# Patient Record
Sex: Male | Born: 1960 | Race: White | Hispanic: No | Smoking: Current every day smoker
Health system: Southern US, Community
[De-identification: ages and names within clinical notes are randomized; demographics above are authoritative.]

## PROBLEM LIST (undated history)

## (undated) DIAGNOSIS — E119 Type 2 diabetes mellitus without complications: Secondary | ICD-10-CM

## (undated) DIAGNOSIS — J189 Pneumonia, unspecified organism: Secondary | ICD-10-CM

## (undated) DIAGNOSIS — G43909 Migraine, unspecified, not intractable, without status migrainosus: Secondary | ICD-10-CM

## (undated) DIAGNOSIS — E782 Mixed hyperlipidemia: Secondary | ICD-10-CM

## (undated) DIAGNOSIS — I10 Essential (primary) hypertension: Secondary | ICD-10-CM

## (undated) DIAGNOSIS — D649 Anemia, unspecified: Secondary | ICD-10-CM

## (undated) DIAGNOSIS — K219 Gastro-esophageal reflux disease without esophagitis: Secondary | ICD-10-CM

## (undated) DIAGNOSIS — M1990 Unspecified osteoarthritis, unspecified site: Secondary | ICD-10-CM

## (undated) DIAGNOSIS — R251 Tremor, unspecified: Secondary | ICD-10-CM

## (undated) DIAGNOSIS — G4730 Sleep apnea, unspecified: Secondary | ICD-10-CM

## (undated) HISTORY — PX: KNEE ARTHROSCOPY W/ DEBRIDEMENT: SHX1867

## (undated) HISTORY — DX: Unspecified osteoarthritis, unspecified site: M19.90

## (undated) HISTORY — DX: Migraine, unspecified, not intractable, without status migrainosus: G43.909

## (undated) HISTORY — DX: Tremor, unspecified: R25.1

## (undated) HISTORY — DX: Mixed hyperlipidemia: E78.2

## (undated) HISTORY — DX: Type 2 diabetes mellitus without complications: E11.9

---

## 1987-08-23 DIAGNOSIS — R091 Pleurisy: Secondary | ICD-10-CM

## 1987-08-23 HISTORY — DX: Pleurisy: R09.1

## 1999-07-05 ENCOUNTER — Encounter: Admission: RE | Admit: 1999-07-05 | Discharge: 1999-07-05 | Payer: Self-pay | Admitting: Emergency Medicine

## 1999-07-05 ENCOUNTER — Encounter: Payer: Self-pay | Admitting: Emergency Medicine

## 2001-03-21 ENCOUNTER — Encounter: Payer: Self-pay | Admitting: Emergency Medicine

## 2001-03-21 ENCOUNTER — Encounter: Admission: RE | Admit: 2001-03-21 | Discharge: 2001-03-21 | Payer: Self-pay | Admitting: Emergency Medicine

## 2003-06-16 ENCOUNTER — Encounter: Admission: RE | Admit: 2003-06-16 | Discharge: 2003-06-16 | Payer: Self-pay | Admitting: Emergency Medicine

## 2003-06-16 ENCOUNTER — Encounter: Payer: Self-pay | Admitting: Emergency Medicine

## 2003-10-30 ENCOUNTER — Emergency Department (HOSPITAL_COMMUNITY): Admission: EM | Admit: 2003-10-30 | Discharge: 2003-10-30 | Payer: Self-pay | Admitting: Emergency Medicine

## 2004-09-20 IMAGING — CR DG FINGER INDEX 2+V*L*
1 series · 1 of 1 positions shown · non-contrast
Comparison: none

CLINICAL DATA: laceration left index finger
 LEFT INDEX FINGER THREE VIEWS
 There is no evidence of acute fracture, dislocation or radiopaque foreign body.  Soft tissue irregularity is noted distally compatible with given history of laceration.  
 IMPRESSION
 No evidence of fracture or foreign body.

[view not recorded]
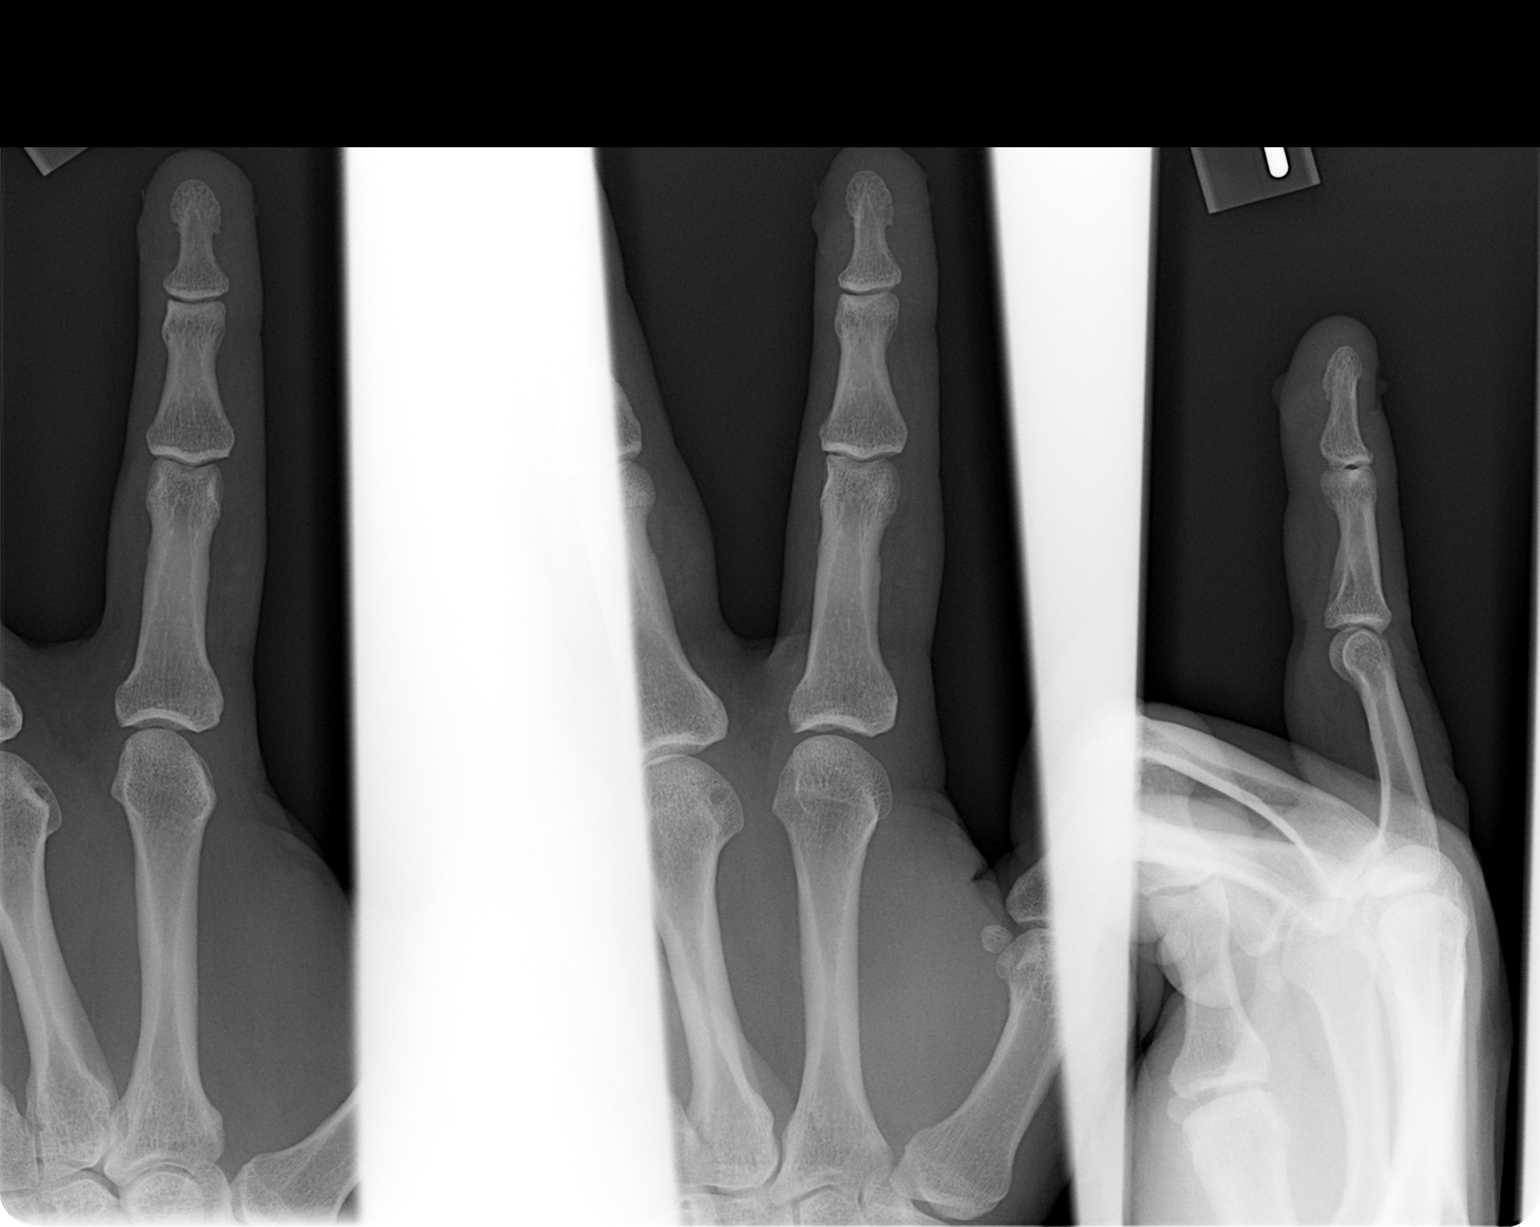

[1 of 1 positions shown; findings below may reference images not displayed]

## 2009-04-02 DIAGNOSIS — M1990 Unspecified osteoarthritis, unspecified site: Secondary | ICD-10-CM | POA: Insufficient documentation

## 2009-04-02 DIAGNOSIS — E669 Obesity, unspecified: Secondary | ICD-10-CM | POA: Insufficient documentation

## 2009-04-02 DIAGNOSIS — J309 Allergic rhinitis, unspecified: Secondary | ICD-10-CM | POA: Insufficient documentation

## 2009-05-06 ENCOUNTER — Encounter: Admission: RE | Admit: 2009-05-06 | Discharge: 2009-05-06 | Payer: Self-pay | Admitting: Family Medicine

## 2014-08-22 DIAGNOSIS — I2699 Other pulmonary embolism without acute cor pulmonale: Secondary | ICD-10-CM

## 2014-08-22 HISTORY — DX: Other pulmonary embolism without acute cor pulmonale: I26.99

## 2019-08-23 HISTORY — PX: PARTIAL HIP ARTHROPLASTY: SHX733

## 2019-09-10 ENCOUNTER — Ambulatory Visit: Payer: BLUE CROSS/BLUE SHIELD | Attending: Internal Medicine

## 2019-09-10 DIAGNOSIS — Z23 Encounter for immunization: Secondary | ICD-10-CM | POA: Insufficient documentation

## 2019-09-10 NOTE — Progress Notes (Signed)
   Covid-19 Vaccination Clinic  Name:  [name]    MRN: [id number] DOB: [birth year]  09/10/2019  [name] was observed post Covid-19 immunization for 15 minutes without incidence. He was provided with Vaccine Information Sheet and instruction to access the V-Safe system.   [name] was instructed to call 911 with any severe reactions post vaccine: Marland Kitchen Difficulty breathing  . Swelling of your face and throat  . A fast heartbeat  . A bad rash all over your body  . Dizziness and weakness    Immunizations Administered    Name Date Dose VIS Date Route   Pfizer COVID-19 Vaccine 09/10/2019  6:49 PM 0.3 mL 08/02/2019 Intramuscular   Manufacturer: [employer], [employer]   Lot: [id number]   NDC: [address]-1

## 2019-10-01 ENCOUNTER — Ambulatory Visit: Payer: BLUE CROSS/BLUE SHIELD | Attending: Internal Medicine

## 2019-10-01 DIAGNOSIS — Z23 Encounter for immunization: Secondary | ICD-10-CM | POA: Insufficient documentation

## 2019-10-01 NOTE — Progress Notes (Signed)
   Covid-19 Vaccination Clinic  Name:  Charles Payne    MRN: [id number] DOB: Mar 25, 1961  10/01/2019  [name] was observed post Covid-19 immunization for 15 minutes without incidence. He was provided with Vaccine Information Sheet and instruction to access the V-Safe system.   [name] was instructed to call 911 with any severe reactions post vaccine: Marland Kitchen Difficulty breathing  . Swelling of your face and throat  . A fast heartbeat  . A bad rash all over your body  . Dizziness and weakness    Immunizations Administered    Name Date Dose VIS Date Route   Pfizer COVID-19 Vaccine 10/01/2019  2:52 PM 0.3 mL 08/02/2019 Intramuscular   Manufacturer: [employer], [employer]   Lot: [id number]   NDC: [address]-2

## 2020-02-07 DIAGNOSIS — M25852 Other specified joint disorders, left hip: Secondary | ICD-10-CM | POA: Insufficient documentation

## 2020-02-07 DIAGNOSIS — R29818 Other symptoms and signs involving the nervous system: Secondary | ICD-10-CM | POA: Insufficient documentation

## 2020-02-07 DIAGNOSIS — G59 Mononeuropathy in diseases classified elsewhere: Secondary | ICD-10-CM | POA: Insufficient documentation

## 2020-02-11 DIAGNOSIS — M51369 Other intervertebral disc degeneration, lumbar region without mention of lumbar back pain or lower extremity pain: Secondary | ICD-10-CM | POA: Insufficient documentation

## 2020-02-11 DIAGNOSIS — M47816 Spondylosis without myelopathy or radiculopathy, lumbar region: Secondary | ICD-10-CM | POA: Insufficient documentation

## 2020-05-22 DIAGNOSIS — M48061 Spinal stenosis, lumbar region without neurogenic claudication: Secondary | ICD-10-CM | POA: Insufficient documentation

## 2020-08-07 DIAGNOSIS — Z96642 Presence of left artificial hip joint: Secondary | ICD-10-CM | POA: Insufficient documentation

## 2022-02-17 DIAGNOSIS — E1165 Type 2 diabetes mellitus with hyperglycemia: Secondary | ICD-10-CM | POA: Insufficient documentation

## 2022-02-17 DIAGNOSIS — E785 Hyperlipidemia, unspecified: Secondary | ICD-10-CM | POA: Insufficient documentation

## 2022-02-17 DIAGNOSIS — E559 Vitamin D deficiency, unspecified: Secondary | ICD-10-CM | POA: Insufficient documentation

## 2022-02-17 LAB — HM HIV SCREENING LAB: HM HIV Screening: NEGATIVE

## 2022-02-17 LAB — HM HEPATITIS C SCREENING LAB: HM Hepatitis Screen: NEGATIVE

## 2022-02-17 LAB — HIV ANTIBODY (ROUTINE TESTING W REFLEX): HIV 1&2 Ab, 4th Generation: NEGATIVE

## 2022-03-31 DIAGNOSIS — G250 Essential tremor: Secondary | ICD-10-CM | POA: Insufficient documentation

## 2022-09-22 DIAGNOSIS — Z419 Encounter for procedure for purposes other than remedying health state, unspecified: Secondary | ICD-10-CM | POA: Diagnosis not present

## 2022-10-21 DIAGNOSIS — Z419 Encounter for procedure for purposes other than remedying health state, unspecified: Secondary | ICD-10-CM | POA: Diagnosis not present

## 2022-11-21 DIAGNOSIS — Z419 Encounter for procedure for purposes other than remedying health state, unspecified: Secondary | ICD-10-CM | POA: Diagnosis not present

## 2022-12-21 DIAGNOSIS — Z419 Encounter for procedure for purposes other than remedying health state, unspecified: Secondary | ICD-10-CM | POA: Diagnosis not present

## 2023-01-21 DIAGNOSIS — Z419 Encounter for procedure for purposes other than remedying health state, unspecified: Secondary | ICD-10-CM | POA: Diagnosis not present

## 2023-02-20 DIAGNOSIS — Z419 Encounter for procedure for purposes other than remedying health state, unspecified: Secondary | ICD-10-CM | POA: Diagnosis not present

## 2023-03-23 DIAGNOSIS — Z419 Encounter for procedure for purposes other than remedying health state, unspecified: Secondary | ICD-10-CM | POA: Diagnosis not present

## 2023-04-23 DIAGNOSIS — Z419 Encounter for procedure for purposes other than remedying health state, unspecified: Secondary | ICD-10-CM | POA: Diagnosis not present

## 2023-05-23 DIAGNOSIS — Z419 Encounter for procedure for purposes other than remedying health state, unspecified: Secondary | ICD-10-CM | POA: Diagnosis not present

## 2023-06-08 DIAGNOSIS — Z87891 Personal history of nicotine dependence: Secondary | ICD-10-CM | POA: Insufficient documentation

## 2023-06-08 DIAGNOSIS — R351 Nocturia: Secondary | ICD-10-CM | POA: Insufficient documentation

## 2023-06-23 DIAGNOSIS — Z419 Encounter for procedure for purposes other than remedying health state, unspecified: Secondary | ICD-10-CM | POA: Diagnosis not present

## 2023-07-23 DIAGNOSIS — Z419 Encounter for procedure for purposes other than remedying health state, unspecified: Secondary | ICD-10-CM | POA: Diagnosis not present

## 2023-08-11 ENCOUNTER — Encounter: Payer: Self-pay | Admitting: Urgent Care

## 2023-08-11 ENCOUNTER — Ambulatory Visit (INDEPENDENT_AMBULATORY_CARE_PROVIDER_SITE_OTHER): Payer: BLUE CROSS/BLUE SHIELD | Admitting: Urgent Care

## 2023-08-11 VITALS — BP 147/82 | HR 58 | Temp 97.9°F | Ht 73.0 in | Wt 244.0 lb

## 2023-08-11 DIAGNOSIS — D649 Anemia, unspecified: Secondary | ICD-10-CM

## 2023-08-11 DIAGNOSIS — R351 Nocturia: Secondary | ICD-10-CM | POA: Insufficient documentation

## 2023-08-11 DIAGNOSIS — H903 Sensorineural hearing loss, bilateral: Secondary | ICD-10-CM

## 2023-08-11 DIAGNOSIS — R252 Cramp and spasm: Secondary | ICD-10-CM

## 2023-08-11 DIAGNOSIS — G250 Essential tremor: Secondary | ICD-10-CM | POA: Insufficient documentation

## 2023-08-11 DIAGNOSIS — K5730 Diverticulosis of large intestine without perforation or abscess without bleeding: Secondary | ICD-10-CM | POA: Insufficient documentation

## 2023-08-11 DIAGNOSIS — E1165 Type 2 diabetes mellitus with hyperglycemia: Secondary | ICD-10-CM | POA: Diagnosis not present

## 2023-08-11 DIAGNOSIS — E782 Mixed hyperlipidemia: Secondary | ICD-10-CM | POA: Diagnosis not present

## 2023-08-11 DIAGNOSIS — M1711 Unilateral primary osteoarthritis, right knee: Secondary | ICD-10-CM | POA: Insufficient documentation

## 2023-08-11 DIAGNOSIS — Z87891 Personal history of nicotine dependence: Secondary | ICD-10-CM

## 2023-08-11 DIAGNOSIS — I10 Essential (primary) hypertension: Secondary | ICD-10-CM | POA: Diagnosis not present

## 2023-08-11 DIAGNOSIS — R030 Elevated blood-pressure reading, without diagnosis of hypertension: Secondary | ICD-10-CM | POA: Insufficient documentation

## 2023-08-11 DIAGNOSIS — Z7984 Long term (current) use of oral hypoglycemic drugs: Secondary | ICD-10-CM

## 2023-08-11 LAB — COMPREHENSIVE METABOLIC PANEL
ALT: 40 U/L (ref 0–53)
AST: 21 U/L (ref 0–37)
Albumin: 4.8 g/dL (ref 3.5–5.2)
Alkaline Phosphatase: 73 U/L (ref 39–117)
BUN: 17 mg/dL (ref 6–23)
CO2: 30 meq/L (ref 19–32)
Calcium: 9.9 mg/dL (ref 8.4–10.5)
Chloride: 105 meq/L (ref 96–112)
Creatinine, Ser: 1.15 mg/dL (ref 0.40–1.50)
GFR: 67.39 mL/min (ref >60.00)
Glucose, Bld: 202 mg/dL — ABNORMAL HIGH (ref 70–99)
Potassium: 5.2 meq/L — ABNORMAL HIGH (ref 3.5–5.1)
Sodium: 143 meq/L (ref 135–145)
Total Bilirubin: 0.6 mg/dL (ref 0.2–1.2)
Total Protein: 7.3 g/dL (ref 6.0–8.3)

## 2023-08-11 LAB — CBC WITH DIFFERENTIAL/PLATELET
Basophils Absolute: 0.1 10*3/uL (ref 0.0–0.1)
Basophils Relative: 0.7 % (ref 0.0–3.0)
Eosinophils Absolute: 0.2 10*3/uL (ref 0.0–0.7)
Eosinophils Relative: 1.8 % (ref 0.0–5.0)
HCT: 45 % (ref 39.0–52.0)
Hemoglobin: 14.5 g/dL (ref 13.0–17.0)
Lymphocytes Relative: 26 % (ref 12.0–46.0)
Lymphs Abs: 2.3 10*3/uL (ref 0.7–4.0)
MCHC: 32.3 g/dL (ref 30.0–36.0)
MCV: 84.8 fL (ref 78.0–100.0)
Monocytes Absolute: 0.6 10*3/uL (ref 0.1–1.0)
Monocytes Relative: 6.7 % (ref 3.0–12.0)
Neutro Abs: 5.8 10*3/uL (ref 1.4–7.7)
Neutrophils Relative %: 64.8 % (ref 43.0–77.0)
Platelets: 224 10*3/uL (ref 150.0–400.0)
RBC: 5.3 Mil/uL (ref 4.22–5.81)
RDW: 16 % — ABNORMAL HIGH (ref 11.5–15.5)
WBC: 9 10*3/uL (ref 4.0–10.5)

## 2023-08-11 MED ORDER — LISINOPRIL 5 MG PO TABS: 5.0000 mg | ORAL_TABLET | Freq: Every morning | ORAL | 0 refills | Status: DC

## 2023-08-11 MED ORDER — ROSUVASTATIN CALCIUM 20 MG PO TABS: 20.0000 mg | ORAL_TABLET | Freq: Every day | ORAL | 3 refills | Status: DC

## 2023-08-11 MED ORDER — BLOOD GLUCOSE TEST VI STRP: 1.0000 | ORAL_STRIP | Freq: Three times a day (TID) | 0 refills | Status: AC

## 2023-08-11 MED ORDER — LANCET DEVICE MISC: 1.0000 | Freq: Three times a day (TID) | 0 refills | Status: AC

## 2023-08-11 MED ORDER — LANCETS MISC. MISC: 1.0000 | Freq: Three times a day (TID) | 0 refills | Status: AC

## 2023-08-11 MED ORDER — BLOOD GLUCOSE MONITORING SUPPL DEVI: 1.0000 | Freq: Three times a day (TID) | 0 refills | Status: AC

## 2023-08-11 NOTE — Assessment & Plan Note (Signed)
Chronic knee pain and swelling, worse with standing. Previous surgery in the 1970s and subsequent intra-articular injections. -Consider referral to orthopedics for further evaluation and management. Pt candidate for TKA

## 2023-08-11 NOTE — Assessment & Plan Note (Signed)
Hearing Loss Patient reports difficulty hearing, particularly in noisy environments. No previous audiology evaluation. -Refer to audiology for hearing evaluation.

## 2023-08-11 NOTE — Assessment & Plan Note (Addendum)
Type 2 Diabetes Mellitus Elevated A1c at 9.5; Current treatment with metformin and glipizide. He was off his medications for 1 month prior to his last A1C as he had run out at that time. -Continue metformin and glipizide. -Consider addition of a third oral medication if postprandial hyperglycemia is confirmed. -New glucometer called in as pt's is several years old. Encouraged daily fasting glucose checks and twice daily post-prandial glucose checks. -Check A1c in one month (on 09/08/2023). -Consider continuous glucose monitoring system if multiple daily doses of medication are required. -referral made to ophthalmology for DM eye exam and screening for macular degeneration

## 2023-08-11 NOTE — Assessment & Plan Note (Signed)
Stable. Well managed with daily propranolol

## 2023-08-11 NOTE — Assessment & Plan Note (Signed)
Smoking Cessation Pt has reduced smoking to 5-6 cigarettes per day. Previously used bupropion for smoking cessation. -Restart bupropion to further quitting smoking. (Pt has Rx at home) -Consider low dose CT lung cancer screening in near future

## 2023-08-11 NOTE — Assessment & Plan Note (Signed)
Last CBC evaluation was in 2021 for some reason. 07/20/20 Hgb- 14.8 08/08/20 Hgb - 11.4; Platelets 141  Will recheck CBC today, will also add an iron panel to see if pt is possibly experiencing RLS at night secondary to low Fe

## 2023-08-11 NOTE — Assessment & Plan Note (Signed)
Hypertension Blood pressure elevated at 150/88, recheck 147/82. Was elevated for the past one year per chart review. Discussed potential benefits of ACE inhibitor for kidney protection in diabetes. -Start low-dose ACE inhibitor  -monitor BP readings at home, goal 120/80

## 2023-08-11 NOTE — Assessment & Plan Note (Signed)
LDL above goal for diabetic patients. Current treatment with atorvastatin may be contributing to muscle cramping. -Switch atorvastatin to rosuvastatin (Crestor) to better control LDL and potentially reduce muscle cramping. -Stop lipitor -Start crestor

## 2023-08-11 NOTE — Patient Instructions (Addendum)
Stop atorvasatin Start rosuvastatin  Check glucose levels fasting every morning. Also check 1-2 hours after eating and record results.  Start lisinopril 5mg  daily - to control BP and prevent kidney disease.  Return in one month for recheck.  Follow up with audiology and ophthalmology as discussed.

## 2023-08-11 NOTE — Assessment & Plan Note (Signed)
Experiencing severe cramping in the inner thighs, particularly at night. Possible causes include uncontrolled diabetes, atorvastatin use, and RLS (secondary to potential iron deficiency anemia) -Check CBC and iron panel today due to previous abnormal results and potential contribution to muscle cramping. -Switch atorvastatin to rosuvastatin (Crestor) to potentially reduce muscle cramping.

## 2023-08-11 NOTE — Progress Notes (Signed)
New Patient Office Visit  Subjective:  Patient ID: Charles Payne, male    DOB: September 28, 1960  Age: 62 y.o. MRN: [id number]  CC:  Chief Complaint  Patient presents with   Establish Care    New pt est care. Concerned about the diabetes meds he's on.   Discussed the use of AI software for clinical note transcription with the patient who gave verbal consent to proceed.  HPI Charles Payne presents to establish care.  62yo male with a known history of essential tremors and type 2 diabetes, presents for a follow-up consultation. The patient reports a significant concern about elevated A1c levels, which were recorded at 9.5 during a recent annual physical in October. Despite being on metformin and glipizide, the patient's A1c levels have not improved. He does states however that he had been off his meds for nearly one month prior to his most recent A1C level, and all readings prior to that were in the 7.0 range. The patient also reports a history of weight loss, from a peak of 280-285 lbs to the current weight of 242 lbs, achieved through dietary modifications and reduced portion sizes. However, the patient experiences nausea when not eating regularly, which he attributes to his diabetes.  The patient's essential tremors are managed with propranolol, taken twice daily. Without propranolol, his hand tremors excessively. This tx works well for him and tolerating without ADRs.  The patient also reports a history of knee issues, with a potential need for knee replacement surgery. The patient manages the knee pain with ice and has noticed an increase in swelling with prolonged standing. He does not want TKA yet due to cost. He cannot have any more knee injections he states as he has "maxed out" this therapy.  The patient also reports experiencing severe cramps in the inner thighs, particularly at night.  The patient has a long history of smoking, currently reduced to about five or six cigarettes a day  from a previous pack a day. He has smoked for nearly 50 years. The patient also reports a decrease in alcohol consumption, now limited to a few beers a week. The patient has been on bupropion for smoking cessation but has stopped taking it due to concerns about its impact on life insurance. Denies any known pulmonary disease.  The patient has a family history of diabetes and kidney disease, with his father having been on dialysis for the last five years of his life.   The patient has not had a CBC check in three years, and the last check showed low platelet count and low hemoglobin. The patient also reports a potential need for an eye exam due to concerns about macular degeneration. He is requesting referral for audiology due to difficulty hearing, particularly in locations with lots of background noise.   Outpatient Encounter Medications as of 08/11/2023  Medication Sig   aspirin 81 MG chewable tablet Chew by mouth.   Blood Glucose Monitoring Suppl DEVI 1 each by Does not apply route in the morning, at noon, and at bedtime. May substitute to any manufacturer covered by patient's insurance.   glipiZIDE (GLUCOTROL) 10 MG tablet Take 1 tablet by mouth 2 (two) times daily.   Glucose Blood (BLOOD GLUCOSE TEST STRIPS) STRP 1 each by In Vitro route in the morning, at noon, and at bedtime. May substitute to any manufacturer covered by patient's insurance.   Lancet Device MISC 1 each by Does not apply route in the morning, at noon, and  at bedtime. May substitute to any manufacturer covered by patient's insurance.   Lancets Misc. MISC 1 each by Does not apply route in the morning, at noon, and at bedtime. May substitute to any manufacturer covered by patient's insurance.   lisinopril (ZESTRIL) 5 MG tablet Take 1 tablet (5 mg total) by mouth in the morning.   metFORMIN (GLUCOPHAGE) 1000 MG tablet TAKE 1 TABLET BY MOUTH TWICE A DAY WITH MORNING AND EVENING MEALS   propranolol (INDERAL) 20 MG tablet Take 1  tablet by mouth 2 (two) times daily.   rosuvastatin (CRESTOR) 20 MG tablet Take 1 tablet (20 mg total) by mouth daily.   [DISCONTINUED] atorvastatin (LIPITOR) 40 MG tablet TAKE 1 TABLET BY MOUTH EVERYDAY AT BEDTIME   [DISCONTINUED] Insulin Pen Needle (COMFORT EZ PEN NEEDLES) 32G X 8 MM MISC Inject subcutaneously daily.   buPROPion (WELLBUTRIN SR) 150 MG 12 hr tablet TO START, TAKE 1 TABLET BY MOUTH ONCE DAILY FOR 3 DAYS, THEN 1 TABLET BY MOUTH TWICE DAILY AS SMOKING CESSATION AID. (Patient not taking: Reported on 08/11/2023)   [DISCONTINUED] ondansetron (ZOFRAN) 4 MG tablet Take by mouth. (Patient not taking: Reported on 08/11/2023)   No facility-administered encounter medications on file as of 08/11/2023.    Past Medical History:  Diagnosis Date   Diabetes (HCC)    Mixed hyperlipidemia    Tremor of both hands     Past Surgical History:  Procedure Laterality Date   KNEE ARTHROSCOPY W/ DEBRIDEMENT Right     History reviewed. No pertinent family history.  Social History   Socioeconomic History   Marital status: Divorced    Spouse name: Not on file   Number of children: Not on file   Years of education: Not on file   Highest education level: Not on file  Occupational History   Not on file  Tobacco Use   Smoking status: Every Day    Types: Cigarettes   Smokeless tobacco: Never  Substance and Sexual Activity   Alcohol use: Yes    Alcohol/week: 4.0 standard drinks of alcohol    Types: 4 Cans of beer per week   Drug use: Not on file   Sexual activity: Not on file  Other Topics Concern   Not on file  Social History Narrative   Not on file   Social Drivers of Health   Financial Resource Strain: Not on File (12/09/2021)   Received from [facility], [place]   Financial Resource Strain    Financial Resource Strain: 0  Food Insecurity: Not at Risk (06/08/2023)   Received from [facility] Insecurity    Food: 1  Transportation Needs: Not at Risk (06/08/2023)   Received from [facility] Needs    Transportation: 1  Physical Activity: Not on File (12/09/2021)   Received from [place], [place]   Physical Activity    Physical Activity: 0  Stress: Not on File (12/09/2021)   Received from [facility], [place]   Stress    Stress: 0  Social Connections: Not on File (05/15/2023)   Received from [facility]   Social Connections    Connectedness: 0  Intimate Partner Violence: Not on file    ROS: as noted in HPI  Objective:  BP (!) 147/82 (BP Location: Left Arm, Cuff Size: Large) Comment: 2nd blood pressure taken  Pulse (!) 58   Temp 97.9 F (36.6 C)   Ht 6\' 1"  (1.854 m)   Wt 244 lb (110.7 kg)   SpO2 96%  BMI 32.19 kg/m   Physical Exam Vitals and nursing note reviewed.  Constitutional:      General: He is not in acute distress.    Appearance: Normal appearance. He is not ill-appearing, toxic-appearing or diaphoretic.  HENT:     Head: Normocephalic and atraumatic.     Right Ear: Ear canal and external ear normal. No drainage, swelling or tenderness. There is no impacted cerumen. Tympanic membrane is scarred (opacified). Tympanic membrane is not injected, erythematous or bulging.     Left Ear: Ear canal and external ear normal. No drainage, swelling or tenderness. There is no impacted cerumen. Tympanic membrane is scarred (opacified). Tympanic membrane is not injected, erythematous or bulging.     Nose: Nose normal.     Mouth/Throat:     Mouth: Mucous membranes are moist.     Pharynx: Oropharynx is clear. No oropharyngeal exudate or posterior oropharyngeal erythema.  Eyes:     General: No scleral icterus.       Right eye: No discharge.        Left eye: No discharge.     Extraocular Movements: Extraocular movements intact.     Pupils: Pupils are equal, round, and reactive to light.  Neck:     Thyroid: No thyroid mass, thyromegaly or thyroid tenderness.  Cardiovascular:     Rate and Rhythm: Regular rhythm. Bradycardia present.     Pulses: Normal pulses.     Heart  sounds: No murmur heard. Pulmonary:     Effort: Pulmonary effort is normal. No respiratory distress.     Breath sounds: Normal breath sounds. No stridor. No wheezing or rhonchi.  Abdominal:     Palpations: Abdomen is soft.  Musculoskeletal:        General: Deformity (OA to R knee; scar to lateral R knee) present.     Cervical back: Normal range of motion and neck supple. No rigidity or tenderness.     Right lower leg: No edema.     Left lower leg: No edema.  Lymphadenopathy:     Cervical: No cervical adenopathy.  Skin:    General: Skin is warm and dry.     Coloration: Skin is not jaundiced.     Findings: No bruising, erythema or rash.  Neurological:     General: No focal deficit present.     Mental Status: He is alert and oriented to person, place, and time.     Motor: No weakness.  Psychiatric:        Mood and Affect: Mood normal.        Behavior: Behavior normal.     Last CBC No results found for: "WBC", "HGB", "HCT", "MCV", "MCH", "RDW", "PLT" Last metabolic panel No results found for: "GLUCOSE", "NA", "K", "CL", "CO2", "BUN", "CREATININE", "EGFR", "CALCIUM", "PHOS", "PROT", "ALBUMIN", "LABGLOB", "AGRATIO", "BILITOT", "ALKPHOS", "AST", "ALT", "ANIONGAP" Last lipids No results found for: "CHOL", "HDL", "LDLCALC", "LDLDIRECT", "TRIG", "CHOLHDL" Last hemoglobin A1c No results found for: "HGBA1C"    Assessment & Plan:  Type 2 diabetes mellitus with hyperglycemia, without long-term current use of insulin (HCC) Assessment & Plan: Type 2 Diabetes Mellitus Elevated A1c at 9.5; Current treatment with metformin and glipizide. He was off his medications for 1 month prior to his last A1C as he had run out at that time. -Continue metformin and glipizide. -Consider addition of a third oral medication if postprandial hyperglycemia is confirmed. -New glucometer called in as pt's is several years old. Encouraged daily fasting glucose checks and twice daily post-prandial  glucose  checks. -Check A1c in one month (on 09/08/2023). -Consider continuous glucose monitoring system if multiple daily doses of medication are required. -referral made to ophthalmology for DM eye exam and screening for macular degeneration  Orders: -     CBC with Differential/Platelet -     Comprehensive metabolic panel -     Iron, TIBC and Ferritin Panel -     Ambulatory referral to Ophthalmology -     Blood Glucose Monitoring Suppl; 1 each by Does not apply route in the morning, at noon, and at bedtime. May substitute to any manufacturer covered by patient's insurance.  Dispense: 1 each; Refill: 0 -     Blood Glucose Test; 1 each by In Vitro route in the morning, at noon, and at bedtime. May substitute to any manufacturer covered by patient's insurance.  Dispense: 100 strip; Refill: 0 -     Lancet Device; 1 each by Does not apply route in the morning, at noon, and at bedtime. May substitute to any manufacturer covered by patient's insurance.  Dispense: 1 each; Refill: 0 -     Lancets Misc.; 1 each by Does not apply route in the morning, at noon, and at bedtime. May substitute to any manufacturer covered by patient's insurance.  Dispense: 100 each; Refill: 0 -     Lisinopril; Take 1 tablet (5 mg total) by mouth in the morning.  Dispense: 90 tablet; Refill: 0  Essential tremor Assessment & Plan: Stable. Well managed with daily propranolol   Mixed hyperlipidemia Assessment & Plan: LDL above goal for diabetic patients. Current treatment with atorvastatin may be contributing to muscle cramping. -Switch atorvastatin to rosuvastatin (Crestor) to better control LDL and potentially reduce muscle cramping. -Stop lipitor -Start crestor  Orders: -     Rosuvastatin Calcium; Take 1 tablet (20 mg total) by mouth daily.  Dispense: 90 tablet; Refill: 3  Personal history of tobacco use Assessment & Plan: Smoking Cessation Pt has reduced smoking to 5-6 cigarettes per day. Previously used bupropion for  smoking cessation. -Restart bupropion to further quitting smoking. (Pt has Rx at home) -Consider low dose CT lung cancer screening in near future   Anemia, unspecified type Assessment & Plan: Last CBC evaluation was in 2021 for some reason. 07/20/20 Hgb- 14.8 08/08/20 Hgb - 11.4; Platelets 141  Will recheck CBC today, will also add an iron panel to see if pt is possibly experiencing RLS at night secondary to low Fe  Orders: -     CBC with Differential/Platelet -     Comprehensive metabolic panel -     Iron, TIBC and Ferritin Panel  Diverticulosis of colon  Primary osteoarthritis of right knee Assessment & Plan: Chronic knee pain and swelling, worse with standing. Previous surgery in the 1970s and subsequent intra-articular injections. -Consider referral to orthopedics for further evaluation and management. Pt candidate for TKA   Sensorineural hearing loss (SNHL) of both ears Assessment & Plan: Hearing Loss Patient reports difficulty hearing, particularly in noisy environments. No previous audiology evaluation. -Refer to audiology for hearing evaluation.  Orders: -     Ambulatory referral to Audiology  Muscle cramp, nocturnal Assessment & Plan: Experiencing severe cramping in the inner thighs, particularly at night. Possible causes include uncontrolled diabetes, atorvastatin use, and RLS (secondary to potential iron deficiency anemia) -Check CBC and iron panel today due to previous abnormal results and potential contribution to muscle cramping. -Switch atorvastatin to rosuvastatin (Crestor) to potentially reduce muscle cramping.    Essential hypertension  Assessment & Plan: Hypertension Blood pressure elevated at 150/88, recheck 147/82. Was elevated for the past one year per chart review. Discussed potential benefits of ACE inhibitor for kidney protection in diabetes. -Start low-dose ACE inhibitor  -monitor BP readings at home, goal 120/80  Orders: -     Lisinopril;  Take 1 tablet (5 mg total) by mouth in the morning.  Dispense: 90 tablet; Refill: 0  Total time spent with patient including face to face, chart review, documentation, personal interpretation of outside lab results, and counseling was 45 minutes.  Return in about 4 weeks (around 09/08/2023).   Maretta Bees, PA

## 2023-08-12 LAB — IRON,TIBC AND FERRITIN PANEL
%SAT: 21 % (ref 20–48)
Ferritin: 73 ng/mL (ref 24–380)
Iron: 75 ug/dL (ref 50–180)
TIBC: 353 ug/dL (ref 250–425)

## 2023-08-14 NOTE — Progress Notes (Signed)
Left Vm for pt to return my call.  

## 2023-08-23 DIAGNOSIS — Z419 Encounter for procedure for purposes other than remedying health state, unspecified: Secondary | ICD-10-CM | POA: Diagnosis not present

## 2023-09-08 ENCOUNTER — Ambulatory Visit (INDEPENDENT_AMBULATORY_CARE_PROVIDER_SITE_OTHER): Payer: BLUE CROSS/BLUE SHIELD | Admitting: Urgent Care

## 2023-09-08 VITALS — BP 129/78 | HR 60 | Wt 246.1 lb

## 2023-09-08 DIAGNOSIS — G250 Essential tremor: Secondary | ICD-10-CM | POA: Diagnosis not present

## 2023-09-08 DIAGNOSIS — Z7984 Long term (current) use of oral hypoglycemic drugs: Secondary | ICD-10-CM

## 2023-09-08 DIAGNOSIS — E782 Mixed hyperlipidemia: Secondary | ICD-10-CM | POA: Diagnosis not present

## 2023-09-08 DIAGNOSIS — I10 Essential (primary) hypertension: Secondary | ICD-10-CM

## 2023-09-08 DIAGNOSIS — Z87891 Personal history of nicotine dependence: Secondary | ICD-10-CM

## 2023-09-08 DIAGNOSIS — L853 Xerosis cutis: Secondary | ICD-10-CM | POA: Insufficient documentation

## 2023-09-08 DIAGNOSIS — E1165 Type 2 diabetes mellitus with hyperglycemia: Secondary | ICD-10-CM

## 2023-09-08 LAB — POCT GLYCOSYLATED HEMOGLOBIN (HGB A1C)
HbA1c POC (<> result, manual entry): 7.6 % (ref 4.0–5.6)
HbA1c, POC (controlled diabetic range): 7.6 % — AB (ref 0.0–7.0)
HbA1c, POC (prediabetic range): 7.6 % — AB (ref 5.7–6.4)
Hemoglobin A1C: 7.6 % — AB (ref 4.0–5.6)

## 2023-09-08 MED ORDER — FREESTYLE LIBRE 14 DAY SENSOR MISC: 1.0000 | 11 refills | Status: DC

## 2023-09-08 MED ORDER — NALTREXONE HCL (PAIN) 4.5 MG PO CAPS: 1.0000 | ORAL_CAPSULE | Freq: Every day | ORAL | 0 refills | Status: DC

## 2023-09-08 MED ORDER — TRIAMCINOLONE ACETONIDE 0.5 % EX CREA: 1.0000 | TOPICAL_CREAM | Freq: Two times a day (BID) | CUTANEOUS | 3 refills | Status: DC

## 2023-09-08 MED ORDER — FREESTYLE LIBRE 14 DAY READER DEVI: 1.0000 | 11 refills | Status: DC

## 2023-09-08 NOTE — Progress Notes (Signed)
Established Patient Office Visit  Subjective:  Patient ID: Charles Payne, male    DOB: October 15, 1960  Age: 63 y.o. MRN: [id number]  Chief Complaint  Patient presents with   Follow-up    Follow up on A1C. He has had an increase in itching on his legs and feet.   Discussed the use of AI software for clinical note transcription with the patient who gave verbal consent to proceed.  History of Present Illness The patient, with a known history of diabetes, presents with a chief complaint of worsening pruritus, primarily affecting the toes and lower extremities. The itching is intermittent, with no associated rash or skin changes, but has been progressively worsening. The patient has attempted to manage the symptoms with pressure application and over-the-counter antifungal treatments with limited success. The patient also reports a history of a persistent fungal infection affecting the right toenail, which has not responded to over-the-counter antifungal treatments.  The patient has noticed an increasing craving for sweets and carbohydrates, which has been challenging to manage. Despite these cravings, the patient has made efforts to improve his diet, incorporating more salads and vegetables, and reducing alcohol and cigarette consumption. The patient reports a significant reduction in smoking, with a pack of cigarettes now lasting up to five days, compared to the previous rate of a pack every two days.  The patient also reports a persistent sensation of lightheadedness, the cause of which remains undetermined. The patient has a history of susceptibility to poison ivy, with severe reactions characterized by blistering. The patient has been using an expired steroid cream to manage the itching associated with these reactions.  The patient has been managing his diabetes with glipizide and metformin, along with bupropion for smoking cessation. The patient's recent lab results showed an improvement in his A1c  levels, dropping from 9.5 to 7.6. However, the patient's potassium levels were slightly elevated, and the patient was previously anemic, but recent tests showed resolution of the anemia. The patient's blood pressure has also improved.  The patient has been adhering to his medication regimen. The patient's daily medications include blood pressure medication in the morning, cholesterol medication at night, and other DM medications twice daily. The patient also takes a daily baby aspirin. The patient has expressed a desire to reduce his reliance on medications to manage his cravings and symptoms.     Patient Active Problem List   Diagnosis Date Noted   Dry skin dermatitis 09/08/2023   Type 2 diabetes mellitus with hyperglycemia, without long-term current use of insulin (HCC) 08/11/2023   Essential tremor 08/11/2023   Mixed hyperlipidemia 08/11/2023   Nocturia 08/11/2023   Personal history of tobacco use 08/11/2023   Anemia 08/11/2023   Diverticulosis of colon 08/11/2023   Muscle cramp, nocturnal 08/11/2023   Sensorineural hearing loss (SNHL) of both ears 08/11/2023   Primary osteoarthritis of right knee 08/11/2023   Essential hypertension 08/11/2023   Past Medical History:  Diagnosis Date   Arthritis    Diabetes (HCC)    Migraines    Mixed hyperlipidemia    Tremor of both hands    Past Surgical History:  Procedure Laterality Date   KNEE ARTHROSCOPY W/ DEBRIDEMENT Right    PARTIAL HIP ARTHROPLASTY  2021   Social History   Tobacco Use   Smoking status: Every Day    Types: Cigarettes   Smokeless tobacco: Never  Substance Use Topics   Alcohol use: Yes    Alcohol/week: 4.0 standard drinks of alcohol  Types: 4 Cans of beer per week      ROS: as noted in HPI  Objective:     BP 129/78   Pulse 60   Wt 246 lb 1.9 oz (111.6 kg)   SpO2 97%   BMI 32.47 kg/m  BP Readings from Last 3 Encounters:  09/08/23 129/78  08/11/23 (!) 147/82   Wt Readings from Last 3 Encounters:   09/08/23 246 lb 1.9 oz (111.6 kg)  08/11/23 244 lb (110.7 kg)      Physical Exam Vitals and nursing note reviewed.  Constitutional:      General: He is not in acute distress.    Appearance: Normal appearance. He is obese. He is not ill-appearing, toxic-appearing or diaphoretic.  HENT:     Head: Normocephalic and atraumatic.     Mouth/Throat:     Mouth: Mucous membranes are moist.  Eyes:     General: No scleral icterus.       Right eye: No discharge.        Left eye: No discharge.     Pupils: Pupils are equal, round, and reactive to light.  Cardiovascular:     Rate and Rhythm: Normal rate.     Pulses:          Dorsalis pedis pulses are 2+ on the right side and 2+ on the left side.  Pulmonary:     Effort: Pulmonary effort is normal. No respiratory distress.  Musculoskeletal:        General: Normal range of motion.     Right lower leg: Edema present.     Left lower leg: Edema present.     Right foot: Normal range of motion. No deformity or bunion.     Left foot: Normal range of motion. No deformity or bunion.       Feet:  Feet:     Right foot:     Skin integrity: Callus (corn) and dry skin present. No ulcer, blister or skin breakdown.     Toenail Condition: Right toenails are abnormally thick.     Left foot:     Skin integrity: Dry skin present. No ulcer, skin breakdown or callus.     Toenail Condition: Left toenails are abnormally thick.  Skin:    General: Skin is warm and dry.     Coloration: Skin is not jaundiced.     Findings: No bruising.  Neurological:     General: No focal deficit present.     Mental Status: He is alert and oriented to person, place, and time.     Sensory: No sensory deficit.     Motor: No weakness.     Gait: Gait normal.  Psychiatric:        Mood and Affect: Mood normal.        Behavior: Behavior normal.      Results for orders placed or performed in visit on 09/08/23  POCT HgB A1C  Result Value Ref Range   Hemoglobin A1C 7.6 (A) 4.0  - 5.6 %   HbA1c POC (<> result, manual entry) 7.6 4.0 - 5.6 %   HbA1c, POC (prediabetic range) 7.6 (A) 5.7 - 6.4 %   HbA1c, POC (controlled diabetic range) 7.6 (A) 0.0 - 7.0 %    Last CBC Lab Results  Component Value Date   WBC 9.0 08/11/2023   HGB 14.5 08/11/2023   HCT 45.0 08/11/2023   MCV 84.8 08/11/2023   RDW 16.0 (H) 08/11/2023   PLT 224.0 08/11/2023  Last metabolic panel Lab Results  Component Value Date   GLUCOSE 202 (H) 08/11/2023   NA 143 08/11/2023   K 5.2 No hemolysis seen (H) 08/11/2023   CL 105 08/11/2023   CO2 30 08/11/2023   BUN 17 08/11/2023   CREATININE 1.15 08/11/2023   GFR 67.39 08/11/2023   CALCIUM 9.9 08/11/2023   PROT 7.3 08/11/2023   ALBUMIN 4.8 08/11/2023   BILITOT 0.6 08/11/2023   ALKPHOS 73 08/11/2023   AST 21 08/11/2023   ALT 40 08/11/2023   Last lipids No results found for: "CHOL", "HDL", "LDLCALC", "LDLDIRECT", "TRIG", "CHOLHDL" Last hemoglobin A1c Lab Results  Component Value Date   HGBA1C 7.6 (A) 09/08/2023   HGBA1C 7.6 09/08/2023   HGBA1C 7.6 (A) 09/08/2023   HGBA1C 7.6 (A) 09/08/2023   Last thyroid functions No results found for: "TSH", "T3TOTAL", "T4TOTAL", "THYROIDAB" Last vitamin D No results found for: "25OHVITD2", "25OHVITD3", "VD25OH" Last vitamin B12 and Folate No results found for: "VITAMINB12", "FOLATE"    The 10-year ASCVD risk score (Arnett DK, et al., 2019) is: 25.8%  Assessment & Plan:  Type 2 diabetes mellitus with hyperglycemia, without long-term current use of insulin (HCC) Assessment & Plan: Type 2 Diabetes Mellitus Improved HbA1c from 9.5 to 7.6. Patient has made lifestyle changes including reduced smoking and alcohol consumption. Patient reports carbohydrate cravings. -Continue current medication regimen:Glipizide twice daily, Metformin 1000mg  twice daily. -Start low-dose Naltrexone to help with carbohydrate cravings. -Encourage continued lifestyle modifications including diet and exercise. -Order  Freestyle Libre for continuous glucose monitoring. -Check HbA1c in 3 months.  Orders: -     POCT glycosylated hemoglobin (Hb A1C) -     FreeStyle Libre 14 Day Reader; 1 Application by Does not apply route every 14 (fourteen) days. Use with sensor system  Dispense: 2 each; Refill: 11 -     FreeStyle Libre 14 Day Sensor; 1 Application by Does not apply route every 14 (fourteen) days. Apply upper deltoid every 14 days, use reader to determine blood sugars  Dispense: 2 each; Refill: 11 -     Naltrexone HCl (Pain); Take 1 capsule by mouth daily.  Dispense: 90 capsule; Refill: 0  Essential hypertension Assessment & Plan: Hypertension Blood pressure well controlled. -Continue Propranolol twice daily and lisinopril once daily   Essential tremor  Mixed hyperlipidemia Assessment & Plan: Hyperlipidemia On Rosuvastatin for cholesterol management. -Continue Rosuvastatin daily.   Dry skin dermatitis Assessment & Plan: Pruritus Itching on toes and legs, possibly due to dry skin. No rash present. Previous use of over-the-counter antifungal with minimal resolution. -Apply Sarna lotion for moisturization and itch relief. -If no significant relief, will prescribe a topical steroid cream.  Orders: -     Triamcinolone Acetonide; Apply 1 Application topically 2 (two) times daily. To affected areas, do not use >14 consecutive days  Dispense: 30 g; Refill: 3  Personal history of tobacco use Assessment & Plan: Smoking Cessation Reduced smoking frequency. On Bupropion. -Continue Bupropion twice daily until 3 months after complete cessation of smoking.    General Health Maintenance -Continue daily baby aspirin. -Check potassium level today due to slight elevation in previous lab. -Check A1c and potassium in 3 months. -Encourage use of MyChart for communication and sharing of glucose readings.  Return in about 3 months (around 12/07/2023).   Maretta Bees, PA

## 2023-09-08 NOTE — Assessment & Plan Note (Signed)
Hypertension Blood pressure well controlled. -Continue Propranolol twice daily and lisinopril once daily

## 2023-09-08 NOTE — Assessment & Plan Note (Signed)
Smoking Cessation Reduced smoking frequency. On Bupropion. -Continue Bupropion twice daily until 3 months after complete cessation of smoking.

## 2023-09-08 NOTE — Assessment & Plan Note (Signed)
Type 2 Diabetes Mellitus Improved HbA1c from 9.5 to 7.6. Patient has made lifestyle changes including reduced smoking and alcohol consumption. Patient reports carbohydrate cravings. -Continue current medication regimen:Glipizide twice daily, Metformin 1000mg  twice daily. -Start low-dose Naltrexone to help with carbohydrate cravings. -Encourage continued lifestyle modifications including diet and exercise. -Order Freestyle Libre for continuous glucose monitoring. -Check HbA1c in 3 months.

## 2023-09-08 NOTE — Patient Instructions (Addendum)
For your lower legs and feet, try OTC sarna. If you don't get significant relief from that, you can use a thin layer of topical triamcinolone.  Start low dose naltrexone once daily to help with carb cravings  Use the freestyle libre glucose monitoring system - monitor 3-4 times daily. Set the upper and lower alarm to 70 on the low, 180 on the high  Return to office on 3 months for a recheck, send me your glucose readings in 1 month on the portal.

## 2023-09-08 NOTE — Assessment & Plan Note (Signed)
Pruritus Itching on toes and legs, possibly due to dry skin. No rash present. Previous use of over-the-counter antifungal with minimal resolution. -Apply Sarna lotion for moisturization and itch relief. -If no significant relief, will prescribe a topical steroid cream.

## 2023-09-08 NOTE — Assessment & Plan Note (Signed)
Hyperlipidemia On Rosuvastatin for cholesterol management. -Continue Rosuvastatin daily.

## 2023-09-15 ENCOUNTER — Encounter: Payer: Self-pay | Admitting: Urgent Care

## 2023-09-18 ENCOUNTER — Other Ambulatory Visit: Payer: Self-pay | Admitting: Urgent Care

## 2023-09-18 DIAGNOSIS — E1165 Type 2 diabetes mellitus with hyperglycemia: Secondary | ICD-10-CM

## 2023-09-18 MED ORDER — DEXCOM G7 RECEIVER DEVI: 0 refills | Status: DC

## 2023-09-18 MED ORDER — DEXCOM G7 SENSOR MISC: 11 refills | Status: DC

## 2023-09-19 ENCOUNTER — Ambulatory Visit: Payer: BLUE CROSS/BLUE SHIELD | Attending: Urgent Care | Admitting: Audiologist

## 2023-09-19 DIAGNOSIS — H903 Sensorineural hearing loss, bilateral: Secondary | ICD-10-CM | POA: Diagnosis present

## 2023-09-19 NOTE — Procedures (Signed)
  Outpatient Audiology and [facility] [address] [place], [place]  [address] [phone]  AUDIOLOGICAL  EVALUATION  NAME: Charles Payne     DOB:   1961-07-13      MRN: [id number]                                                                                     DATE: 09/19/2023     REFERENT: Maretta Bees, PA STATUS: Outpatient DIAGNOSIS: Sensorineural Hearing Loss   History: Rynell was seen for an audiological evaluation due to difficulty hearing in noise. This has been going on for many years.  Moishy denies pain, pressure, or tinnitus. He has occasional ringing. He did go scuba diving months ago and has had difficulty clearing on ear ever since. He feels the pressure is off in the left ear. He has not had a hearing test before.  Laddie no history of hazardous noise exposure outside of some concerts in the 80s. Medical history shows diabetes type two which can be a risk for hearing loss.    Evaluation:  Otoscopy showed a clear view of the tympanic membranes, bilaterally. Small amount of wax in left ear, not blocking. Tympanometry results were consistent with normal middle ear function, bilaterally   Audiometric testing was completed using Conventional Audiometry techniques with insert earphones and supraural headphones. Test results are consistent with mild to moderate high pitched sensorineural hearing loss from 3-8kHz. Speech Recognition Thresholds were obtained at 20dB HL in the right ear and at 25dB HL in the left ear. Word Recognition Testing was completed at  40dB SL and [place] scored 100% in each ear with masking.    Results:  The test results were reviewed with Maisie Fus. He has a mild to moderate high pitched hearing loss related to damage from age. This will make it hard to hear people from a distance. Communicate face to face to use cues as needed. Not yet at a degree where aids are necessary, but he can certainly try them if he feels he will use them  daily. Audiogram printed and provided to [employer].   Recommendations: Annual audiometric testing recommended to monitor hearing loss for progression.    35 minutes spent testing and counseling on results.   If you have any questions please feel free to contact me at [phone].  Ammie Ferrier Au.D.  Audiologist   09/19/2023  10:59 AM  Cc: Maretta Bees, PA

## 2023-09-23 DIAGNOSIS — Z419 Encounter for procedure for purposes other than remedying health state, unspecified: Secondary | ICD-10-CM | POA: Diagnosis not present

## 2023-10-21 DIAGNOSIS — Z419 Encounter for procedure for purposes other than remedying health state, unspecified: Secondary | ICD-10-CM | POA: Diagnosis not present

## 2023-12-02 DIAGNOSIS — Z419 Encounter for procedure for purposes other than remedying health state, unspecified: Secondary | ICD-10-CM | POA: Diagnosis not present

## 2024-01-01 DIAGNOSIS — Z419 Encounter for procedure for purposes other than remedying health state, unspecified: Secondary | ICD-10-CM | POA: Diagnosis not present

## 2024-01-02 ENCOUNTER — Other Ambulatory Visit: Payer: Self-pay

## 2024-01-02 ENCOUNTER — Encounter (HOSPITAL_COMMUNITY): Payer: Self-pay | Admitting: Pharmacy Technician

## 2024-01-02 ENCOUNTER — Encounter (HOSPITAL_COMMUNITY): Payer: Self-pay

## 2024-01-02 ENCOUNTER — Ambulatory Visit (HOSPITAL_COMMUNITY)
Admission: EM | Admit: 2024-01-02 | Discharge: 2024-01-02 | Disposition: A | Discharge status: Home / Self Care | Attending: Family Medicine | Admitting: Family Medicine

## 2024-01-02 ENCOUNTER — Emergency Department (HOSPITAL_COMMUNITY)
Admission: EM | Admit: 2024-01-02 | Discharge: 2024-01-03 | Disposition: A | Discharge status: Home / Self Care | Payer: Worker's Compensation | Attending: Student | Admitting: Student

## 2024-01-02 DIAGNOSIS — R6884 Jaw pain: Secondary | ICD-10-CM | POA: Diagnosis not present

## 2024-01-02 DIAGNOSIS — S0181XA Laceration without foreign body of other part of head, initial encounter: Secondary | ICD-10-CM

## 2024-01-02 DIAGNOSIS — I10 Essential (primary) hypertension: Secondary | ICD-10-CM | POA: Diagnosis not present

## 2024-01-02 DIAGNOSIS — K0889 Other specified disorders of teeth and supporting structures: Secondary | ICD-10-CM | POA: Diagnosis not present

## 2024-01-02 DIAGNOSIS — Z7984 Long term (current) use of oral hypoglycemic drugs: Secondary | ICD-10-CM | POA: Insufficient documentation

## 2024-01-02 DIAGNOSIS — Z79899 Other long term (current) drug therapy: Secondary | ICD-10-CM | POA: Diagnosis not present

## 2024-01-02 DIAGNOSIS — E1165 Type 2 diabetes mellitus with hyperglycemia: Secondary | ICD-10-CM | POA: Diagnosis not present

## 2024-01-02 DIAGNOSIS — S01511A Laceration without foreign body of lip, initial encounter: Secondary | ICD-10-CM | POA: Diagnosis present

## 2024-01-02 DIAGNOSIS — Y990 Civilian activity done for income or pay: Secondary | ICD-10-CM | POA: Diagnosis not present

## 2024-01-02 DIAGNOSIS — W01198A Fall on same level from slipping, tripping and stumbling with subsequent striking against other object, initial encounter: Secondary | ICD-10-CM | POA: Insufficient documentation

## 2024-01-02 LAB — CBG MONITORING, ED: Glucose-Capillary: 127 mg/dL — ABNORMAL HIGH (ref 70–99)

## 2024-01-02 MED ORDER — IBUPROFEN 400 MG PO TABS
600.0000 mg | ORAL_TABLET | Freq: Once | ORAL | Status: AC
  Administered 2024-01-02: 600 mg via ORAL
  Filled 2024-01-02: qty 1

## 2024-01-02 MED ORDER — ONDANSETRON 4 MG PO TBDP
4.0000 mg | ORAL_TABLET | Freq: Once | ORAL | Status: AC
  Administered 2024-01-02: 4 mg via ORAL
  Filled 2024-01-02: qty 1

## 2024-01-02 MED ORDER — LIDOCAINE-EPINEPHRINE (PF) 2 %-1:200000 IJ SOLN
10.0000 mL | Freq: Once | INTRAMUSCULAR | Status: AC
  Administered 2024-01-02: 10 mL
  Filled 2024-01-02: qty 20

## 2024-01-02 NOTE — ED Notes (Signed)
 Patient is being discharged from the Urgent Care and sent to the Emergency Department via POV . Per Dr. Ellsworth Haas, patient is in need of higher level of care due to fall, facial injury with tooth involvement. Patient is aware and verbalizes understanding of plan of care.  Vitals:   01/02/24 1734 01/02/24 1736  BP:  133/80  Pulse:  75  Resp: 16   Temp: 98.5 F (36.9 C)   SpO2:  97%

## 2024-01-02 NOTE — ED Provider Notes (Signed)
 [facility]    CSN: [id number] Arrival date & time: 01/02/24  1721      History   Chief Complaint Chief Complaint  Patient presents with   Facial Laceration   Fall    HPI Charles Payne is a 63 y.o. male.    Fall  Here for a cut to his upper lip and pain in his front teeth and upper lip.  He works at [employer] and tripped on something and fell onto his face flat.  No loss of consciousness.  He sustained a cut to his lip and he feels like the front teeth are movable and he is having some pain in the dental ridge above his upper incisors.  He does not take any blood thinners except he does take a baby aspirin.    Past Medical History:  Diagnosis Date   Arthritis    Diabetes (HCC)    Migraines    Mixed hyperlipidemia    Tremor of both hands     Patient Active Problem List   Diagnosis Date Noted   Dry skin dermatitis 09/08/2023   Type 2 diabetes mellitus with hyperglycemia, without long-term current use of insulin (HCC) 08/11/2023   Essential tremor 08/11/2023   Mixed hyperlipidemia 08/11/2023   Nocturia 08/11/2023   Personal history of tobacco use 08/11/2023   Anemia 08/11/2023   Diverticulosis of colon 08/11/2023   Muscle cramp, nocturnal 08/11/2023   Sensorineural hearing loss (SNHL) of both ears 08/11/2023   Primary osteoarthritis of right knee 08/11/2023   Essential hypertension 08/11/2023    Past Surgical History:  Procedure Laterality Date   KNEE ARTHROSCOPY W/ DEBRIDEMENT Right    PARTIAL HIP ARTHROPLASTY  2021       Home Medications    Prior to Admission medications   Medication Sig Start Date End Date Taking? Authorizing Provider  Blood Glucose Monitoring Suppl DEVI 1 each by Does not apply route in the morning, at noon, and at bedtime. May substitute to any manufacturer covered by patient's insurance. 08/11/23   Crain, Whitney L, PA  buPROPion (WELLBUTRIN SR) 150 MG 12 hr tablet TO START, TAKE 1 TABLET BY MOUTH ONCE DAILY FOR  3 DAYS, THEN 1 TABLET BY MOUTH TWICE DAILY AS SMOKING CESSATION AID. Patient not taking: Reported on 09/08/2023 07/03/23   [provider]  Continuous Glucose Receiver (DEXCOM G7 RECEIVER) DEVI Use daily with G7 sensor 09/18/23   Crain, Whitney L, PA  Continuous Glucose Sensor (DEXCOM G7 SENSOR) MISC Apply sensor for 10 days. 09/18/23   Crain, Whitney L, PA  glipiZIDE (GLUCOTROL) 10 MG tablet Take 1 tablet by mouth 2 (two) times daily. 12/14/19   [provider]  lisinopril  (ZESTRIL ) 5 MG tablet Take 1 tablet (5 mg total) by mouth in the morning. 08/11/23   Crain, Whitney L, PA  metFORMIN (GLUCOPHAGE) 1000 MG tablet TAKE 1 TABLET BY MOUTH TWICE A DAY WITH MORNING AND EVENING MEALS 10/01/19   [provider]  Naltrexone  HCl, Pain, 4.5 MG CAPS Take 1 capsule by mouth daily. 09/08/23   Crain, Whitney L, PA  propranolol (INDERAL) 20 MG tablet Take 1 tablet by mouth 2 (two) times daily. 06/08/23   [provider]  rosuvastatin  (CRESTOR ) 20 MG tablet Take 1 tablet (20 mg total) by mouth daily. 08/11/23   Crain, Whitney L, PA  triamcinolone  cream (KENALOG ) 0.5 % Apply 1 Application topically 2 (two) times daily. To affected areas, do not use >14 consecutive days 09/08/23  Mandy Second, PA    Family History History reviewed. No pertinent family history.  Social History Social History   Tobacco Use   Smoking status: Every Day    Types: Cigarettes   Smokeless tobacco: Never  Vaping Use   Vaping status: Never Used  Substance Use Topics   Alcohol use: Yes    Alcohol/week: 4.0 standard drinks of alcohol    Types: 4 Cans of beer per week   Drug use: Never     Allergies   Patient has no known allergies.   Review of Systems Review of Systems   Physical Exam Triage Vital Signs ED Triage Vitals  Encounter Vitals Group     BP 01/02/24 1736 133/80     Systolic BP Percentile --      Diastolic BP Percentile --      Pulse Rate 01/02/24 1736 75     Resp  01/02/24 1734 16     Temp 01/02/24 1734 98.5 F (36.9 C)     Temp Source 01/02/24 1734 Oral     SpO2 01/02/24 1736 97 %     Weight --      Height --      Head Circumference --      Peak Flow --      Pain Score 01/02/24 1736 5     Pain Loc --      Pain Education --      Exclude from Growth Chart --    No data found.  Updated Vital Signs BP 133/80 (BP Location: Right Arm)   Pulse 75   Temp 98.5 F (36.9 C) (Oral)   Resp 16   SpO2 97%   Visual Acuity Right Eye Distance:   Left Eye Distance:   Bilateral Distance:    Right Eye Near:   Left Eye Near:    Bilateral Near:     Physical Exam Vitals reviewed.  Constitutional:      General: He is not in acute distress.    Appearance: He is not ill-appearing, toxic-appearing or diaphoretic.  HENT:     Mouth/Throat:     Comments: There is a nearly circular laceration on his middle upper lip.  It is oozing just a little blood at the time of exam.  I cannot elicit any definite movement of the upper incisors but he notes more pain when I do that. Eyes:     Extraocular Movements: Extraocular movements intact.     Pupils: Pupils are equal, round, and reactive to light.  Skin:    Coloration: Skin is not pale.  Neurological:     Mental Status: He is alert and oriented to person, place, and time.  Psychiatric:        Behavior: Behavior normal.      UC Treatments / Results  Labs (all labs ordered are listed, but only abnormal results are displayed) Labs Reviewed - No data to display  EKG   Radiology No results found.  Procedures Procedures (including critical care time)  Medications Ordered in UC Medications - No data to display  Initial Impression / Assessment and Plan / UC Course  I have reviewed the triage vital signs and the nursing notes.  Pertinent labs & imaging results that were available during my care of the patient were reviewed by me and considered in my medical decision making (see chart for  details).     I have asked him to proceed to the emergency room because it sounds like he  may have an injury of the upper dental ridge and may need a Panorex that we do not have here in urgent care setting.  He is agreeable and will go by private car. Final Clinical Impressions(s) / UC Diagnoses   Final diagnoses:  Facial laceration, initial encounter  Mandible pain     Discharge Instructions      Patient will go to the emergency room for further evaluation and treatment   ED Prescriptions   None    PDMP not reviewed this encounter.   Ann Keto, MD 01/02/24 [phone]

## 2024-01-02 NOTE — ED Triage Notes (Signed)
 Pt here after tripping in the dark and hitting his face. Laceration to R upper lip. Complains of pain to upper tooth. Denies blood thinners, denies LOC.

## 2024-01-02 NOTE — Discharge Instructions (Addendum)
 Patient will go to the emergency room for further evaluation and treatment

## 2024-01-02 NOTE — ED Triage Notes (Addendum)
 Patient works at [employer] and tripped on something in the dark and fell on his face. Patient has a laceration to the upper lip and is currently bleeding. No LOC.  Patient also c/o front teeth hurting.  Patient states his tetanus is less than 5 years   Patient takes an aspirin 81 mg daily.

## 2024-01-02 NOTE — ED Provider Notes (Signed)
 [place] EMERGENCY DEPARTMENT AT [facility] Provider Note   CSN: [id number] Arrival date & time: 01/02/24  1753     History No chief complaint on file.   Charles Payne is a 63 y.o. male with medical history of arthritis, diabetes, migraines, bilateral hand tremor, hypertension.  Patient presents to ED for evaluation of fall, lip laceration, dental pain.  Reports that he works at [employer].  States that he was going from a very brightly lit room to a dark room.  States that he tripped over something on the floor and struck his face on an object on the wall.  Denies loss consciousness.  Reports he was able to pick himself off the ground.  Denies neck pain.  He is here complaining of pain to his dental ridge above upper incisors.  No blood thinners.  Also here complaining of possible hyperglycemia.  States he is nauseous which is typical for him when he is hyperglycemic.  Has not had any food since 10 AM but reports that he becomes hyperglycemic when he feels like this.  Reports he does not check his CBG at home.  Denies any other concerns.  Reports he was sent over from urgent care for [facility] x-ray imaging however patient teeth are firmly in place.  HPI     Home Medications Prior to Admission medications   Medication Sig Start Date End Date Taking? Authorizing Provider  Blood Glucose Monitoring Suppl DEVI 1 each by Does not apply route in the morning, at noon, and at bedtime. May substitute to any manufacturer covered by patient's insurance. 08/11/23   [facility], Whitney L, PA  buPROPion (WELLBUTRIN SR) 150 MG 12 hr tablet TO START, TAKE 1 TABLET BY MOUTH ONCE DAILY FOR 3 DAYS, THEN 1 TABLET BY MOUTH TWICE DAILY AS SMOKING CESSATION AID. Patient not taking: Reported on 09/08/2023 07/03/23   [provider]  Continuous Glucose Receiver (DEXCOM G7 RECEIVER) DEVI Use daily with G7 sensor 09/18/23   [facility], Whitney L, PA  Continuous Glucose Sensor (DEXCOM G7 SENSOR) MISC Apply  sensor for 10 days. 09/18/23   [facility], Whitney L, PA  glipiZIDE (GLUCOTROL) 10 MG tablet Take 1 tablet by mouth 2 (two) times daily. 12/14/19   [provider]  lisinopril  (ZESTRIL ) 5 MG tablet Take 1 tablet (5 mg total) by mouth in the morning. 08/11/23   [facility], Whitney L, PA  metFORMIN (GLUCOPHAGE) 1000 MG tablet TAKE 1 TABLET BY MOUTH TWICE A DAY WITH MORNING AND EVENING MEALS 10/01/19   [provider]  Naltrexone  HCl, Pain, 4.5 MG CAPS Take 1 capsule by mouth daily. 09/08/23   [facility], Whitney L, PA  propranolol (INDERAL) 20 MG tablet Take 1 tablet by mouth 2 (two) times daily. 06/08/23   [provider]  rosuvastatin  (CRESTOR ) 20 MG tablet Take 1 tablet (20 mg total) by mouth daily. 08/11/23   [facility], Whitney L, PA  triamcinolone  cream (KENALOG ) 0.5 % Apply 1 Application topically 2 (two) times daily. To affected areas, do not use >14 consecutive days 09/08/23   [facility], Whitney L, PA      Allergies    Patient has no known allergies.    Review of Systems   Review of Systems  Physical Exam Updated Vital Signs BP (!) 142/77 (BP Location: Right Arm)   Pulse 74   Temp 98.4 F (36.9 C)   Resp 16   SpO2 94%  Physical Exam    ED Results / Procedures / Treatments  Labs (all labs ordered are listed, but only abnormal results are displayed) Labs Reviewed  CBG MONITORING, ED    EKG None  Radiology No results found.  Procedures Procedures  {Document cardiac monitor, telemetry assessment procedure when appropriate:1}  Medications Ordered in ED Medications  lidocaine-EPINEPHrine (XYLOCAINE W/EPI) 2 %-1:200000 (PF) injection 10 mL (has no administration in time range)  ondansetron (ZOFRAN-ODT) disintegrating tablet 4 mg (has no administration in time range)    ED Course/ Medical Decision Making/ A&P   {   Click here for ABCD2, HEART and other calculatorsREFRESH Note before signing :1}                              Medical Decision Making Amount and/or  Complexity of Data Reviewed Radiology: ordered.  Risk Prescription drug management.   ***  {Document critical care time when appropriate:1} {Document review of labs and clinical decision tools ie heart score, Chads2Vasc2 etc:1}  {Document your independent review of radiology images, and any outside records:1} {Document your discussion with family members, caretakers, and with consultants:1} {Document social determinants of health affecting pt's care:1} {Document your decision making why or why not admission, treatments were needed:1} Final Clinical Impression(s) / ED Diagnoses Final diagnoses:  None    Rx / DC Orders ED Discharge Orders     None

## 2024-01-03 ENCOUNTER — Emergency Department (HOSPITAL_COMMUNITY)

## 2024-01-03 ENCOUNTER — Other Ambulatory Visit (HOSPITAL_COMMUNITY)

## 2024-01-03 NOTE — Discharge Instructions (Signed)
 It was a pleasure taking part in your care.  As we discussed, please keep wound covered for the next 24 hours.  Do not submerge your head over the wound in the bodies of water.  You may take showers and allow water to run over the wound.  Please follow-up with your PCP, this ED or urgent care in 7 to 10 days.  Read attached guide concerning laceration care.  Please also read attached guide concerning dental resources in the area to follow-up with.  Please ibuprofen or Tylenol for pain.  Return to the ED with any new or worsening symptoms.

## 2024-01-12 ENCOUNTER — Emergency Department (HOSPITAL_COMMUNITY)
Admission: EM | Admit: 2024-01-12 | Discharge: 2024-01-12 | Discharge status: Against Medical Advice | Payer: Worker's Compensation | Attending: Emergency Medicine | Admitting: Emergency Medicine

## 2024-01-12 ENCOUNTER — Encounter (HOSPITAL_COMMUNITY): Payer: Self-pay

## 2024-01-12 ENCOUNTER — Other Ambulatory Visit: Payer: Self-pay

## 2024-01-12 DIAGNOSIS — Z4802 Encounter for removal of sutures: Secondary | ICD-10-CM | POA: Insufficient documentation

## 2024-01-12 DIAGNOSIS — Z5321 Procedure and treatment not carried out due to patient leaving prior to being seen by health care provider: Secondary | ICD-10-CM | POA: Diagnosis not present

## 2024-01-12 NOTE — ED Provider Triage Note (Signed)
 Emergency Medicine Provider Triage Evaluation Note  Charles Payne , a 63 y.o. male  was evaluated in triage.  Pt complains of suture removal.  Patient states he had a laceration on his upper lip that 6 stitches were placed and he went to the urgent care and could only get 5 out as the provider could not find the 6th one.  Review of Systems  Positive:  Negative:   Physical Exam  BP (!) 154/99 (BP Location: Right Arm)   Pulse 76   Temp 98.4 F (36.9 C) (Oral)   Resp 16   Ht 6\' 1"  (1.854 m)   Wt 111.6 kg   SpO2 97%   BMI 32.46 kg/m  Gen:   Awake, no distress   Resp:  Normal effort  MSK:   Moves extremities without difficulty  Other:  Scab noted above upper lip however unable to find the 6th stitch  Medical Decision Making  Medically screening exam initiated at 5:10 PM.  Appropriate orders placed.  Charles Payne was informed that the remainder of the evaluation will be completed by another provider, this initial triage assessment does not replace that evaluation, and the importance of remaining in the ED until their evaluation is complete.  Workup initiated, I did attempt to remove the 6 stitch however I believe it is within the scab and when the scab started to come off it does appear that there is still healing that needs to happen underneath and bleeding occurred and so will soak with gauze to help soften the area and have the patient wait for the provide in the back.   Denese Finn, PA-C 01/12/24 1712

## 2024-01-12 NOTE — ED Triage Notes (Signed)
 Pt had 6 sutures to upper lip on May 13 and had 5 of them removed today. One was unable to be removed because it is hidden behind the scab that has formed. Denies any other complaints.

## 2024-01-12 NOTE — ED Notes (Signed)
 Patient said that they will return another time as it is becoming busy and do not want to wait.

## 2024-01-19 ENCOUNTER — Telehealth: Payer: Self-pay | Admitting: Urgent Care

## 2024-01-19 NOTE — Telephone Encounter (Signed)
 Pt would like a TOC to [place]. Please advise.

## 2024-01-22 NOTE — Telephone Encounter (Signed)
 No, I would prefer not. He is an old acquaintance, and he has quite a medical history, think he will be better served if Dr. Boston Byers is willing to accept him, thanks.

## 2024-01-25 ENCOUNTER — Encounter: Payer: Self-pay | Admitting: Family

## 2024-02-01 DIAGNOSIS — Z419 Encounter for procedure for purposes other than remedying health state, unspecified: Secondary | ICD-10-CM | POA: Diagnosis not present

## 2024-02-12 DIAGNOSIS — G4733 Obstructive sleep apnea (adult) (pediatric): Secondary | ICD-10-CM | POA: Insufficient documentation

## 2024-02-12 DIAGNOSIS — F17200 Nicotine dependence, unspecified, uncomplicated: Secondary | ICD-10-CM | POA: Insufficient documentation

## 2024-02-12 DIAGNOSIS — I2699 Other pulmonary embolism without acute cor pulmonale: Secondary | ICD-10-CM | POA: Insufficient documentation

## 2024-02-12 DIAGNOSIS — H2513 Age-related nuclear cataract, bilateral: Secondary | ICD-10-CM | POA: Insufficient documentation

## 2024-02-12 LAB — HM DIABETES EYE EXAM

## 2024-02-22 NOTE — Telephone Encounter (Signed)
 Pt is now requesting to Transfer care from [facility] to [place]

## 2024-02-29 ENCOUNTER — Encounter: Payer: Self-pay | Admitting: Internal Medicine

## 2024-03-02 DIAGNOSIS — Z419 Encounter for procedure for purposes other than remedying health state, unspecified: Secondary | ICD-10-CM | POA: Diagnosis not present

## 2024-03-22 ENCOUNTER — Ambulatory Visit: Payer: Worker's Compensation | Admitting: Plastic Surgery

## 2024-03-22 VITALS — BP 144/71 | HR 57 | Ht 73.0 in | Wt 250.0 lb

## 2024-03-22 DIAGNOSIS — S01511D Laceration without foreign body of lip, subsequent encounter: Secondary | ICD-10-CM | POA: Diagnosis not present

## 2024-03-22 DIAGNOSIS — S01511A Laceration without foreign body of lip, initial encounter: Secondary | ICD-10-CM | POA: Insufficient documentation

## 2024-03-22 NOTE — Progress Notes (Signed)
 Patient ID: Charles Payne, male    DOB: 12/30/1960, 63 y.o.   MRN: [id number]   Chief Complaint  Patient presents with   Advice Only   Skin Problem    The patient is a 63 year old male here for evaluation of his upper lip.  He was involved in a fall in the parking lot at work.  He sustained a laceration to his upper lip.  It looks like it cross the vermilion.  It was fixed between the ER and an urgent care.  This happened in May and he still has some firmness that he is uncomfortable with and would like to see if that can be improved upon.  He is also worried there may be a stitch left.  The firm area is approximately 4 x 8 mm in size and raised slightly red as well.     Review of Systems  Constitutional: Negative.   Eyes: Negative.   Respiratory: Negative.    Cardiovascular: Negative.   Gastrointestinal: Negative.   Genitourinary: Negative.     Past Medical History:  Diagnosis Date   Arthritis    Diabetes (HCC)    Migraines    Mixed hyperlipidemia    Tremor of both hands     Past Surgical History:  Procedure Laterality Date   KNEE ARTHROSCOPY W/ DEBRIDEMENT Right    PARTIAL HIP ARTHROPLASTY  2021      Current Outpatient Medications:    Blood Glucose Monitoring Suppl DEVI, 1 each by Does not apply route in the morning, at noon, and at bedtime. May substitute to any manufacturer covered by patient's insurance., Disp: 1 each, Rfl: 0   Continuous Glucose Receiver (DEXCOM G7 RECEIVER) DEVI, Use daily with G7 sensor, Disp: 1 each, Rfl: 0   Continuous Glucose Sensor (DEXCOM G7 SENSOR) MISC, Apply sensor for 10 days., Disp: 3 each, Rfl: 11   glipiZIDE (GLUCOTROL) 10 MG tablet, Take 1 tablet by mouth 2 (two) times daily., Disp: , Rfl:    metFORMIN (GLUCOPHAGE) 1000 MG tablet, TAKE 1 TABLET BY MOUTH TWICE A DAY WITH MORNING AND EVENING MEALS, Disp: , Rfl:    Naltrexone  HCl, Pain, 4.5 MG CAPS, Take 1 capsule by mouth daily., Disp: 90 capsule, Rfl: 0   propranolol (INDERAL) 20  MG tablet, Take 1 tablet by mouth 2 (two) times daily., Disp: , Rfl:    triamcinolone  cream (KENALOG ) 0.5 %, Apply 1 Application topically 2 (two) times daily. To affected areas, do not use >14 consecutive days, Disp: 30 g, Rfl: 3   buPROPion (WELLBUTRIN SR) 150 MG 12 hr tablet, TO START, TAKE 1 TABLET BY MOUTH ONCE DAILY FOR 3 DAYS, THEN 1 TABLET BY MOUTH TWICE DAILY AS SMOKING CESSATION AID. (Patient not taking: Reported on 03/22/2024), Disp: , Rfl:    lisinopril  (ZESTRIL ) 5 MG tablet, Take 1 tablet (5 mg total) by mouth in the morning. (Patient not taking: Reported on 03/22/2024), Disp: 90 tablet, Rfl: 0   rosuvastatin  (CRESTOR ) 20 MG tablet, Take 1 tablet (20 mg total) by mouth daily. (Patient not taking: Reported on 03/22/2024), Disp: 90 tablet, Rfl: 3   Objective:   Vitals:   03/22/24 0950  BP: (!) 144/71  Pulse: (!) 57  SpO2: 97%    Physical Exam HENT:     Head: Normocephalic.   Cardiovascular:     Rate and Rhythm: Normal rate.     Pulses: Normal pulses.  Skin:    General: Skin is warm.  Capillary Refill: Capillary refill takes less than 2 seconds.     Coloration: Skin is not jaundiced.     Findings: Lesion present. No bruising.  Neurological:     Mental Status: He is alert and oriented to person, place, and time.  Psychiatric:        Mood and Affect: Mood normal.        Behavior: Behavior normal.        Thought Content: Thought content normal.        Judgment: Judgment normal.     Assessment & Plan:  Lip laceration, initial encounter  Because there was such a trauma involved with such force I do think waiting a little bit longer to see if we can get it to improve on its own would be helpful.  If it does not then I certainly can do an excision and repair.  I have encouraged him to do a lot of massage to the area.  I will plan to see him back in 2 months.  Pictures were obtained of the patient and placed in the chart with the patient's or guardian's permission.

## 2024-04-02 DIAGNOSIS — Z419 Encounter for procedure for purposes other than remedying health state, unspecified: Secondary | ICD-10-CM | POA: Diagnosis not present

## 2024-04-09 ENCOUNTER — Encounter: Payer: Self-pay | Admitting: Internal Medicine

## 2024-04-09 ENCOUNTER — Ambulatory Visit: Payer: Self-pay | Admitting: Internal Medicine

## 2024-04-09 ENCOUNTER — Ambulatory Visit (INDEPENDENT_AMBULATORY_CARE_PROVIDER_SITE_OTHER): Admitting: Internal Medicine

## 2024-04-09 VITALS — BP 138/82 | HR 66 | Temp 97.5°F | Ht 73.0 in | Wt 243.6 lb

## 2024-04-09 DIAGNOSIS — G243 Spasmodic torticollis: Secondary | ICD-10-CM | POA: Diagnosis not present

## 2024-04-09 DIAGNOSIS — M5412 Radiculopathy, cervical region: Secondary | ICD-10-CM

## 2024-04-09 DIAGNOSIS — E0829 Diabetes mellitus due to underlying condition with other diabetic kidney complication: Secondary | ICD-10-CM | POA: Insufficient documentation

## 2024-04-09 DIAGNOSIS — L989 Disorder of the skin and subcutaneous tissue, unspecified: Secondary | ICD-10-CM

## 2024-04-09 DIAGNOSIS — E782 Mixed hyperlipidemia: Secondary | ICD-10-CM

## 2024-04-09 DIAGNOSIS — M1990 Unspecified osteoarthritis, unspecified site: Secondary | ICD-10-CM

## 2024-04-09 DIAGNOSIS — M5030 Other cervical disc degeneration, unspecified cervical region: Secondary | ICD-10-CM

## 2024-04-09 DIAGNOSIS — F17219 Nicotine dependence, cigarettes, with unspecified nicotine-induced disorders: Secondary | ICD-10-CM

## 2024-04-09 DIAGNOSIS — M542 Cervicalgia: Secondary | ICD-10-CM | POA: Diagnosis not present

## 2024-04-09 DIAGNOSIS — Z7985 Long-term (current) use of injectable non-insulin antidiabetic drugs: Secondary | ICD-10-CM

## 2024-04-09 DIAGNOSIS — E1165 Type 2 diabetes mellitus with hyperglycemia: Secondary | ICD-10-CM

## 2024-04-09 DIAGNOSIS — I10 Essential (primary) hypertension: Secondary | ICD-10-CM

## 2024-04-09 DIAGNOSIS — M48061 Spinal stenosis, lumbar region without neurogenic claudication: Secondary | ICD-10-CM

## 2024-04-09 LAB — COMPREHENSIVE METABOLIC PANEL WITH GFR
ALT: 19 U/L (ref 0–53)
AST: 17 U/L (ref 0–37)
Albumin: 4.5 g/dL (ref 3.5–5.2)
Alkaline Phosphatase: 59 U/L (ref 39–117)
BUN: 13 mg/dL (ref 6–23)
CO2: 22 meq/L (ref 19–32)
Calcium: 9.1 mg/dL (ref 8.4–10.5)
Chloride: 108 meq/L (ref 96–112)
Creatinine, Ser: 0.97 mg/dL (ref 0.40–1.50)
GFR: 82.27 mL/min (ref >60.00)
Glucose, Bld: 140 mg/dL — ABNORMAL HIGH (ref 70–99)
Potassium: 4.3 meq/L (ref 3.5–5.1)
Sodium: 138 meq/L (ref 135–145)
Total Bilirubin: 0.4 mg/dL (ref 0.2–1.2)
Total Protein: 7.5 g/dL (ref 6.0–8.3)

## 2024-04-09 LAB — CBC WITH DIFFERENTIAL/PLATELET
Basophils Absolute: 0.1 K/uL (ref 0.0–0.1)
Basophils Relative: 0.5 % (ref 0.0–3.0)
Eosinophils Absolute: 0.2 K/uL (ref 0.0–0.7)
Eosinophils Relative: 1.6 % (ref 0.0–5.0)
HCT: 43.4 % (ref 39.0–52.0)
Hemoglobin: 13.9 g/dL (ref 13.0–17.0)
Lymphocytes Relative: 19.7 % (ref 12.0–46.0)
Lymphs Abs: 2 K/uL (ref 0.7–4.0)
MCHC: 32.1 g/dL (ref 30.0–36.0)
MCV: 81.7 fl (ref 78.0–100.0)
Monocytes Absolute: 0.8 K/uL (ref 0.1–1.0)
Monocytes Relative: 8.3 % (ref 3.0–12.0)
Neutro Abs: 7.2 K/uL (ref 1.4–7.7)
Neutrophils Relative %: 69.9 % (ref 43.0–77.0)
Platelets: 278 K/uL (ref 150.0–400.0)
RBC: 5.32 Mil/uL (ref 4.22–5.81)
RDW: 16.4 % — ABNORMAL HIGH (ref 11.5–15.5)
WBC: 10.3 K/uL (ref 4.0–10.5)

## 2024-04-09 LAB — LIPID PANEL
Cholesterol: 182 mg/dL (ref 0–200)
HDL: 39.5 mg/dL (ref >39.00)
LDL Cholesterol: 122 mg/dL — ABNORMAL HIGH (ref 0–99)
NonHDL: 142.89
Total CHOL/HDL Ratio: 5
Triglycerides: 102 mg/dL (ref 0.0–149.0)
VLDL: 20.4 mg/dL (ref 0.0–40.0)

## 2024-04-09 LAB — MICROALBUMIN / CREATININE URINE RATIO
Creatinine,U: 124.8 mg/dL
Microalb Creat Ratio: 52.1 mg/g — ABNORMAL HIGH (ref 0.0–30.0)
Microalb, Ur: 6.5 mg/dL — ABNORMAL HIGH (ref 0.0–1.9)

## 2024-04-09 LAB — HEMOGLOBIN A1C: Hgb A1c MFr Bld: 7.3 % — ABNORMAL HIGH (ref 4.6–6.5)

## 2024-04-09 MED ORDER — OZEMPIC (0.25 OR 0.5 MG/DOSE) 2 MG/3ML ~~LOC~~ SOPN: 0.2500 mg | PEN_INJECTOR | SUBCUTANEOUS | 5 refills | Status: DC

## 2024-04-09 MED ORDER — ROSUVASTATIN CALCIUM 20 MG PO TABS: 20.0000 mg | ORAL_TABLET | Freq: Every day | ORAL | 3 refills | Status: AC

## 2024-04-09 MED ORDER — CYCLOBENZAPRINE HCL 10 MG PO TABS: 10.0000 mg | ORAL_TABLET | Freq: Three times a day (TID) | ORAL | 0 refills | Status: DC | PRN

## 2024-04-09 MED ORDER — LISINOPRIL 5 MG PO TABS: 5.0000 mg | ORAL_TABLET | Freq: Every morning | ORAL | 0 refills | Status: DC

## 2024-04-09 MED ORDER — KETOROLAC TROMETHAMINE 10 MG PO TABS: 10.0000 mg | ORAL_TABLET | Freq: Four times a day (QID) | ORAL | 0 refills | Status: DC | PRN

## 2024-04-09 MED ORDER — METHYLPREDNISOLONE 4 MG PO TBPK: ORAL_TABLET | ORAL | 0 refills | Status: DC

## 2024-04-09 MED ORDER — KETOROLAC TROMETHAMINE 60 MG/2ML IM SOLN
60.0000 mg | Freq: Once | INTRAMUSCULAR | Status: AC
  Administered 2024-04-09: 60 mg via INTRAMUSCULAR

## 2024-04-09 NOTE — Assessment & Plan Note (Signed)
 Hyperlipidemia management with previous atorvastatin use. Currently not on statin therapy. Prescribe rosuvastatin  for cholesterol management.

## 2024-04-09 NOTE — Patient Instructions (Addendum)
 It was a pleasure seeing you today! Your health and satisfaction are our top priorities.  Charles Cone, MD  VISIT SUMMARY: Today, you were seen for neck pain and a facial skin lesion. We discussed your history of arthritis, past hip replacement, and your current medications for diabetes and other conditions. You also shared your concerns about chronic nausea, smoking, and difficulty accessing local healthcare providers.  YOUR PLAN: -CERVICAL RADICULOPATHY AND DEGENERATIVE DISC DISEASE: You have neck pain radiating down your arm, likely due to a pinched nerve from a cervical disc issue, possibly worsened by your fall in May 2025. We gave you a Toradol  injection for immediate pain relief and prescribed cyclobenzaprine  (Flexeril ) to help relax your muscles at bedtime. Use cold packs for the next 48-72 hours, then switch to heat. A memory foam pillow can help support your neck. Rest and avoid heavy lifting. If your symptoms persist or worsen, we may consider physical therapy and imaging. We also discussed the potential for a worker's compensation claim related to your fall.  -TYPE 2 DIABETES MELLITUS WITH CHRONIC NAUSEA FROM METFORMIN: Your chronic nausea is likely due to metformin. Your A1c levels have been high, and Medicaid may cover Ozempic , which could help control your blood sugar and reduce nausea. We will prescribe semaglutide  (Ozempic ) pending insurance approval and order a standard diabetes lab panel. If Ozempic  is effective, we may discontinue metformin.  -ACTINIC KERATOSIS OF FACIAL SKIN (SUSPECTED): The rough skin under your eye is concerning for actinic keratosis, a condition that can potentially progress to cancer. We will refer you to dermatology for evaluation and possible treatment with topical chemotherapy.  -GENERALIZED OSTEOARTHRITIS: You have osteoarthritis and have had a hip replacement. You are also a candidate for knee replacement.  -LUMBAR FACET ARTHROPATHY AND SPINAL STENOSIS:  Your intermittent back pain and difficulty standing straight are consistent with lumbar facet arthropathy and spinal stenosis.  -HYPERLIPIDEMIA: You have high cholesterol. We will prescribe rosuvastatin  to help manage your cholesterol levels.  -HYPERTENSION: Your blood pressure is borderline elevated. We will prescribe lisinopril  if your blood pressures are high at home.  -NICOTINE DEPENDENCE: You have a long history of smoking but have reduced to five cigarettes per day. We discussed your previous attempt to quit using bupropion.  INSTRUCTIONS: Please follow up with dermatology for the evaluation of your facial skin lesion. If your neck pain persists or worsens, consider physical therapy and imaging. Monitor your blood pressure at home, and if it remains high, start taking lisinopril  as prescribed. We will also wait for insurance approval for Ozempic  and follow up on your diabetes management.  Your Providers PCP: Charles Payne MATSU, MD,  [phone]) Referring Provider: Lowella Benton Payne, Charles Payne,  [phone])  NEXT STEPS: [x]  Early Intervention: Schedule sooner appointment, call our on-call services, or go to emergency room if there is any significant Increase in pain or discomfort New or worsening symptoms Sudden or severe changes in your health [x]  Flexible Follow-Up: We recommend a Return in about 3 weeks (around 04/30/2024), or diabetes and lab rv. for optimal routine care. This allows for progress monitoring and treatment adjustments. [x]  Preventive Care: Schedule your annual preventive care visit! It's typically covered by insurance and helps identify potential health issues early. [x]  Lab & X-ray Appointments: Incomplete tests scheduled today, or call to schedule. X-rays: [facility] (M-F, 8:30am-noon or 1pm-5pm). [x]  Medical Information Release: Sign a release form at front desk to obtain relevant medical information we don't have.  MAKING THE MOST  OF OUR FOCUSED 20  MINUTE APPOINTMENTS: [x]   Clearly state your top concerns at the beginning of the visit to focus our discussion [x]   If you anticipate you will need more time, please inform the front desk during scheduling - we can book multiple appointments in the same week. [x]   If you have transportation problems- use our convenient video appointments or ask about transportation support. [x]   We can get down to business faster if you use MyChart to update information before the visit and submit non-urgent questions before your visit. Thank you for taking the time to provide details through MyChart.  Let our nurse know and she can import this information into your encounter documents.  Arrival and Wait Times: [x]   Arriving on time ensures that everyone receives prompt attention. [x]   Early morning (8a) and afternoon (1p) appointments tend to have shortest wait times. [x]   Unfortunately, we cannot delay appointments for late arrivals or hold slots during phone calls.  Getting Answers and Following Up [x]   Simple Questions & Concerns: For quick questions or basic follow-up after your visit, reach us  at [phone] or MyChart messaging. [x]   Complex Concerns: If your concern is more complex, scheduling an appointment might be best. Discuss this with the staff to find the most suitable option. [x]   Lab & Imaging Results: We'll contact you directly if results are abnormal or you don't use MyChart. Most normal results will be on MyChart within 2-3 business days, with a review message from Dr. [name]. Haven't heard back in 2 weeks? Need results sooner? Contact us  at [phone]. [x]   Referrals: Our referral coordinator will manage specialist referrals. The specialist's office should contact you within 2 weeks to schedule an appointment. Call us  if you haven't heard from them after 2 weeks.  Staying Connected [x]   MyChart: Activate your MyChart for the fastest way to access results and message us . See the last  page of this paperwork for instructions on how to activate.  Bring to Your Next Appointment [x]   Medications: Please bring all your medication bottles to your next appointment to ensure we have an accurate record of your prescriptions. [x]   Health Diaries: If you're monitoring any health conditions at home, keeping a diary of your readings can be very helpful for discussions at your next appointment.  Billing [x]   X-ray & Lab Orders: These are billed by separate companies. Contact the invoicing company directly for questions or concerns. [x]   Visit Charges: Discuss any billing inquiries with our administrative services team.  Your Satisfaction Matters [x]   Share Your Experience: We strive for your satisfaction! If you have any complaints, or preferably compliments, please let Dr. [name] know directly or contact our Practice Administrators, Manuelita Rubin or [employer], by asking at the front desk.   Reviewing Your Records [x]   Review this early draft of your clinical encounter notes below and the final encounter summary tomorrow on MyChart after its been completed.  All orders placed so far are visible here: Skin lesion of cheek Assessment & Plan: Refer care to to dermatology.  Orders: -     Ambulatory referral to Dermatology  Neck pain -     Ketorolac  Tromethamine  -     Ketorolac  Tromethamine ; Take 1 tablet (10 mg total) by mouth every 6 (six) hours as needed.  Dispense: 20 tablet; Refill: 0 -     Cyclobenzaprine  HCl; Take 1 tablet (10 mg total) by mouth 3 (three) times daily as needed for muscle spasms (  sedative.  only take for muscle stiffness at bedtime).  Dispense: 30 tablet; Refill: 0 -     methylPREDNISolone ; Use as directed.  Dispense: 21 each; Refill: 0  Generalized arthritis  Cervical radiculopathy -     Ketorolac  Tromethamine  -     Ketorolac  Tromethamine ; Take 1 tablet (10 mg total) by mouth every 6 (six) hours as needed.  Dispense: 20 tablet; Refill: 0 -      Cyclobenzaprine  HCl; Take 1 tablet (10 mg total) by mouth 3 (three) times daily as needed for muscle spasms (sedative.  only take for muscle stiffness at bedtime).  Dispense: 30 tablet; Refill: 0  DDD (degenerative disc disease), cervical -     Ketorolac  Tromethamine  -     Ketorolac  Tromethamine ; Take 1 tablet (10 mg total) by mouth every 6 (six) hours as needed.  Dispense: 20 tablet; Refill: 0 -     Cyclobenzaprine  HCl; Take 1 tablet (10 mg total) by mouth 3 (three) times daily as needed for muscle spasms (sedative.  only take for muscle stiffness at bedtime).  Dispense: 30 tablet; Refill: 0 -     methylPREDNISolone ; Use as directed.  Dispense: 21 each; Refill: 0  Torticollis, spasmodic  Type 2 diabetes mellitus with hyperglycemia, without long-term current use of insulin (HCC) -     Lisinopril ; Take 1 tablet (5 mg total) by mouth in the morning.  Dispense: 90 tablet; Refill: 0 -     CBC with Differential/Platelet -     Comprehensive metabolic panel with GFR -     Lipid panel -     Hemoglobin A1c -     Microalbumin / creatinine urine ratio -     Ozempic  (0.25 or 0.5 MG/DOSE); Inject 0.25 mg into the skin once a week. Cut metformin back to 500 twice daily when this is started  Dispense: 3 mL; Refill: 5  Essential hypertension -     Lisinopril ; Take 1 tablet (5 mg total) by mouth in the morning.  Dispense: 90 tablet; Refill: 0  Mixed hyperlipidemia -     Rosuvastatin  Calcium ; Take 1 tablet (20 mg total) by mouth daily.  Dispense: 90 tablet; Refill: 3  Degenerative lumbar spinal stenosis  Cigarette nicotine dependence with nicotine-induced disorder           ASSESSMENT: Degenerative osteoarthritis and degenerative disk disease of vertebral column Clinical Reasoning: Patient presents with localized back pain consistent with degenerative changes. Pain characteristics (location, quality, radiation pattern) and physical exam findings support diagnosis. Pain is mechanical in nature,  worsened with activity and improved with rest. Differential Diagnosis Excluded: Bone tumor - No palpable masses, no unexplained weight loss, no pain that worsens at night Acute fracture - No severe trauma, no significant bone loss, no point tenderness over vertebrae Aortic aneurysm - No hypertension history, no smoking history, absence of tearing/ripping sensations Vertebral disk infection - No fever, no recent infections, no IVDU history Cauda equina syndrome - No weakness/numbness, no loss of bowel or bladder function  RISK ASSESSMENT: Low risk for serious pathology based on absence of red flags. Patient educated on emergency warning signs requiring immediate evaluation. However, do suspect remote cervical disk injury with facial trauma fall 01/02/2024. TREATMENT PLAN   Acute Pain Management Rest for 24-48 hours, then gradual return to activity as tolerated Cold packs for 20 minutes every 2-3 hours for first 48-72 hours Transition to heat therapy after 72 hours if preferred (no sleeping on heating pad) Acetaminophen 650mg  every 6 hours as needed  for pain NSAIDs (if not contraindicated): Toradol  tablets instead of ibupren every 6 hours with food and plenty of fluids Muscle relaxant: Cyclobenzaprine  10 mg at bedtime as needed for muscle spasm  Activity Modification Proper body mechanics reviewed for lifting techniques Avoid heavy lifting (>10 lbs) for 2 weeks Gradual return to normal activities as tolerated Maintain good posture during sitting, standing, and sleeping Need memory foam pillows  Rehabilitation Home exercise program instructions provided in After Visit Summary Focus on gentle stretching and core strengthening exercises Physical Therapy referral deferred for comprehensive evaluation and treatment - he will talk to workers compensation case worker Begin exercises once acute pain subsides (typically 3-5 days)  Diagnostics X-ray of cervicalr spine ordered to assess for degenerative  changes after Caseworker discussion Advanced imaging not indicated at this time given absence of red flags Will reassess need for further workup based on treatment response  Follow-up Plan Return to clinic in 2-3 weeks if symptoms persist despite treatment Contact office sooner if symptoms worsen or if red flag symptoms develop Physical therapy to begin within 1-2 weeks if he lets us  know.   RED FLAG WARNING SIGNS - GO TO EMERGENCY ROOM IF YOU DEVELOP: New weakness or numbness in legs Loss of bladder or bowel control Saddle anesthesia (numbness in groin/rectal area) Severe, progressive, or disabling pain despite treatment Fever over 101F with back pain Sudden onset of severe tearing/ripping pain    EVIDENCE-BASED RECOMMENDATIONS: Treatment plan follows current clinical guidelines from the [employer] and [employer]. Early mobility, appropriate pain control, and progressive exercise have demonstrated better outcomes than prolonged rest or early imaging for non-specific back pain without red flags.

## 2024-04-09 NOTE — Progress Notes (Signed)
 [facility]  Phone: [phone]  - Medical Office Visit -  Visit Date: 04/09/2024 Patient: Charles Payne   DOB: [birth year]   63 y.o. Male  MRN: [id number] Patient Care Team: [name], MD as PCP - General (Internal Medicine) Today's Health Care Provider: [name] [name], MD  ===========================================    Chief Complaint / Reason for Visit: New pt (Pt is present to est care with pcp skin concerns would like referral to dermo. ) and Neck Pain (Neck hurting today was like this when woke up this morning has had in the past but today not getting no better.)   Background: 63 y.o. male who has Type 2 diabetes mellitus with hyperglycemia, without long-term current use of insulin (HCC); Essential tremor; Hyperlipidemia; Nocturia; Personal history of tobacco use; Anemia; Diverticulosis of colon; Muscle cramp, nocturnal; Sensorineural hearing loss (SNHL) of both ears; Primary osteoarthritis of right knee; Essential hypertension; Dry skin dermatitis; Lip laceration; Skin lesion of cheek; Neck pain; DDD (degenerative disc disease), lumbar; Osteoarthritis; Allergic rhinitis; Degenerative lumbar spinal stenosis; Hip impingement syndrome, left; Neurogenic claudication; Nicotine dependence; Nuclear sclerosis of both eyes; Obesity, unspecified; Obstructive sleep apnea syndrome; and Vitamin D deficiency on their problem list.  Discussed the use of AI scribe software for clinical note transcription with the patient, who gave verbal consent to proceed.  History of Present Illness 63 year old male who presents with neck pain and a facial skin lesion.  He woke up this morning with severe neck pain, different from previous episodes. The pain radiates through his shoulder and arm. He has a history of arthritis and a past hip replacement but no neck or shoulder surgeries. He recalls a fall on Jan 02, 2024, where he landed on his face, which may have contributed to his  current neck issues. He experiences difficulty turning his head to the left and finds relief by raising his arm. No fever, body aches, or other systemic symptoms.  He has a rough skin lesion under his eye present for about five years. Initially not visible, it is now noticeable in the mirror and feels like a scratch. He is concerned about its potential seriousness.  He has generalized arthritis and has undergone a hip replacement. He is a candidate for knee replacement but has not pursued it due to concerns about recovery time. He experiences intermittent back pain lasting several days, affecting his ability to stand up straight.  He has a history of nicotine dependence, previously smoking up to a pack a day. He has reduced to five cigarettes a day and sometimes none. He has attempted to quit using medications like bupropion but found them ineffective.  He takes metformin and glipizide for diabetes, and propranolol for other conditions. He experiences chronic nausea, especially when not eating, which he attributes to metformin. He has a history of elevated A1c levels, previously as high as nine, and does not monitor his blood sugar regularly due to cost issues with testing supplies.  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 He lives in [place] and has difficulty accessing local healthcare providers due to insurance coverage issues.  Photographs Taken 04/09/2024 :       Problem overviews updated today: Problem  Skin Lesion of Cheek  Neck Pain  Nicotine Dependence  Nuclear Sclerosis of Both Eyes  Obstructive Sleep Apnea Syndrome  Nocturia  Essential Tremor  Type 2 Diabetes Mellitus With Hyperglycemia, Without Long-Term Current Use of Insulin (Hcc)  Hyperlipidemia  Vitamin D Deficiency  Degenerative Lumbar Spinal Stenosis  Ddd (Degenerative Disc Disease), Lumbar  Hip Impingement Syndrome, Left  Neurogenic Claudication  Osteoarthritis    Osteoarthritis Of The Knee  10/1 IMO update Formatting of this note might be different from the original. Arthritis 10/1 IMO update Formatting of this note might be different from the original. Osteoarthritis Of The Knee 10/1 IMO update Formatting of this note might be different from the original. Osteoarthritis 10/1 IMO update Formatting of this note might be different from the original. Osteoarthritis 10/1 IMO update Formatting of this note might be different from the original. Osteoarthritis 10/1 IMO update  Osteoarthritis 10/1 IMO update   Allergic Rhinitis   Formatting of this note might be different from the original. Allergic Rhinitis 10/1 IMO update Formatting of this note might be different from the original. Allergic Rhinitis 10/1 IMO update Formatting of this note might be different from the original. 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 Obesity 10/1 IMO update Formatting of this note might be different from the original. Obesity 10/1 IMO update     Medications updated/reviewed: Current Outpatient Medications on File Prior to Visit  Medication Sig   aspirin 81 MG chewable tablet Chew 81 mg by mouth daily.   glipiZIDE (GLUCOTROL) 10 MG tablet Take 1 tablet by mouth 2 (two) times daily.   metFORMIN (GLUCOPHAGE) 1000 MG tablet TAKE 1 TABLET BY MOUTH TWICE A DAY WITH MORNING AND EVENING MEALS   propranolol (INDERAL) 20 MG tablet Take 1 tablet by mouth 2 (two) times daily.   triamcinolone  cream (KENALOG ) 0.5 % Apply 1 Application  topically 2 (two) times daily. To affected areas, do not use >14 consecutive days   Blood Glucose Monitoring Suppl DEVI 1 each by Does not apply route in the morning, at noon, and at bedtime. May substitute to any manufacturer covered by patient's insurance.   Naltrexone  HCl, Pain, 4.5 MG CAPS Take 1 capsule by mouth daily.   No current facility-administered medications on file prior to visit.   Medications Discontinued During This Encounter  Medication Reason   buPROPion (WELLBUTRIN SR) 150 MG 12 hr tablet    Continuous Glucose Receiver (DEXCOM G7 RECEIVER) DEVI    Continuous Glucose Sensor (DEXCOM G7 SENSOR) MISC    rosuvastatin  (CRESTOR ) 20 MG tablet Reorder   lisinopril  (ZESTRIL ) 5 MG tablet Reorder   Current Meds  Medication Sig   aspirin 81 MG chewable tablet Chew 81 mg by mouth daily.   cyclobenzaprine  (FLEXERIL ) 10 MG tablet Take 1 tablet (10 mg total) by mouth 3 (three) times daily as needed for muscle spasms (sedative.  only take for muscle stiffness at bedtime).   glipiZIDE (GLUCOTROL) 10 MG tablet Take 1 tablet by mouth 2 (two) times daily.   ketorolac  (TORADOL ) 10 MG tablet Take 1 tablet (10 mg total) by mouth every 6 (six) hours as needed.   metFORMIN (GLUCOPHAGE) 1000 MG tablet TAKE 1 TABLET BY MOUTH TWICE A DAY WITH MORNING AND EVENING MEALS   methylPREDNISolone  (MEDROL  DOSEPAK) 4 MG TBPK tablet Use as directed.   propranolol (  INDERAL) 20 MG tablet Take 1 tablet by mouth 2 (two) times daily.   Semaglutide ,0.25 or 0.5MG /DOS, (OZEMPIC , 0.25 OR 0.5 MG/DOSE,) 2 MG/3ML SOPN Inject 0.25 mg into the skin once a week. Cut metformin back to 500 twice daily when this is started   triamcinolone  cream (KENALOG ) 0.5 % Apply 1 Application topically 2 (two) times daily. To affected areas, do not use >14 consecutive days    Allergies:  Patient has no known allergies. Past Medical History:  has a past medical history of Arthritis, Diabetes (HCC), Lumbar facet arthropathy (02/11/2020),  Migraines, Mixed hyperlipidemia, Obesity (BMI 35.0-39.9 without comorbidity) (04/02/2009), Personal history of tobacco use, presenting hazards to health (06/08/2023), Pulmonary embolism (HCC) (02/12/2024), and Tremor of both hands. Past Surgical History:   has a past surgical history that includes Knee arthroscopy w/ debridement (Right) and Partial hip arthroplasty (2021). Social History:   reports that he has been smoking cigarettes. He has never used smokeless tobacco. He reports current alcohol use of about 4.0 standard drinks of alcohol per week. He reports that he does not use drugs. Family History:  family history is not on file. Depression Screen and Health Maintenance:    04/09/2024   10:19 AM  PHQ 2/9 Scores  PHQ - 2 Score 0   Health Maintenance  Topic Date Due   Colonoscopy  Never done   Zoster Vaccines- Shingrix (2 of 2) 05/26/2022   INFLUENZA VACCINE  03/22/2024   COVID-19 Vaccine (3 - 2024-25 season) 04/25/2024 (Originally 04/23/2023)   FOOT EXAM  09/07/2024   HEMOGLOBIN A1C  10/10/2024   OPHTHALMOLOGY EXAM  02/11/2025   Diabetic kidney evaluation - eGFR measurement  04/09/2025   Diabetic kidney evaluation - Urine ACR  04/09/2025   DTaP/Tdap/Td (3 - Td or Tdap) 03/31/2032   Pneumococcal Vaccine: 50+ Years  Completed   Hepatitis C Screening  Completed   HIV Screening  Completed   Hepatitis B Vaccines 19-59 Average Risk  Aged Out   HPV VACCINES  Aged Out   Meningococcal B Vaccine  Aged Out   Pneumococcal Vaccine  Discontinued   Immunization History  Administered Date(s) Administered   Fluzone Influenza virus vaccine,trivalent (IIV3), split virus 06/10/2020   Influenza, Seasonal, Injecte, Preservative Fre 06/08/2023   PFIZER(Purple Top)SARS-COV-2 Vaccination 09/10/2019, 10/01/2019   PNEUMOCOCCAL CONJUGATE-20 03/31/2022   Pneumococcal Polysaccharide-23 11/20/2018   Tdap 12/21/2011, 03/31/2022   Zoster Recombinant(Shingrix) 03/31/2022   Zoster, Live 06/15/2012      Objective   Physical ExamBP 138/82   Pulse 66   Temp (!) 97.5 F (36.4 C) (Temporal)   Ht 6' 1 (1.854 m)   Wt 243 lb 9.6 oz (110.5 kg)   SpO2 98%   BMI 32.14 kg/m  Wt Readings from Last 10 Encounters:  04/09/24 243 lb 9.6 oz (110.5 kg)  03/22/24 250 lb (113.4 kg)  01/12/24 246 lb (111.6 kg)  09/08/23 246 lb 1.9 oz (111.6 kg)  08/11/23 244 lb (110.7 kg)  Vital signs reviewed.  Nursing notes reviewed. Weight trend reviewed. Abnormalities and problem-specific physical exam findings:  rash/facial as imaged Body mass index is 32.14 kg/m.  General Appearance:  Well developed, well nourished, well-groomed, healthy-appearing male with Body mass index is 32.14 kg/m. No acute distress appreciable.   Skin: Clear and well-hydrated. Pulmonary:  Normal work of breathing at rest, no respiratory distress apparent. SpO2: 98 %  Musculoskeletal: He demonstrates smooth and coordinated movements throughout all major joints.All extremities are intact.  Neurological:  Awake, alert, oriented, and engaged.  No obvious focal neurological deficits or cognitive impairments.  Sensorium seems unclouded.  Psychiatric:  Appropriate mood, pleasant and cooperative demeanor, cheerful and engaged during the exam  Reviewed Results & Data Results     Results for orders placed or performed in visit on 04/09/24  HM DIABETES EYE EXAM  Result Value Ref Range   HM Diabetic Eye Exam No Retinopathy No Retinopathy  Results for orders placed or performed in visit on 04/09/24  CBC with Differential/Platelet  Result Value Ref Range   WBC 10.3 4.0 - 10.5 K/uL   RBC 5.32 4.22 - 5.81 Mil/uL   Hemoglobin 13.9 13.0 - 17.0 g/dL   HCT 56.5 60.9 - 47.9 %   MCV 81.7 78.0 - 100.0 fl   MCHC 32.1 30.0 - 36.0 g/dL   RDW 83.5 (H) 88.4 - 84.4 %   Platelets 278.0 150.0 - 400.0 K/uL   Neutrophils Relative % 69.9 43.0 - 77.0 %   Lymphocytes Relative 19.7 12.0 - 46.0 %   Monocytes Relative 8.3 3.0 - 12.0 %   Eosinophils Relative  1.6 0.0 - 5.0 %   Basophils Relative 0.5 0.0 - 3.0 %   Neutro Abs 7.2 1.4 - 7.7 K/uL   Lymphs Abs 2.0 0.7 - 4.0 K/uL   Monocytes Absolute 0.8 0.1 - 1.0 K/uL   Eosinophils Absolute 0.2 0.0 - 0.7 K/uL   Basophils Absolute 0.1 0.0 - 0.1 K/uL  Comprehensive metabolic panel with GFR  Result Value Ref Range   Sodium 138 135 - 145 mEq/L   Potassium 4.3 3.5 - 5.1 mEq/L   Chloride 108 96 - 112 mEq/L   CO2 22 19 - 32 mEq/L   Glucose, Bld 140 (H) 70 - 99 mg/dL   BUN 13 6 - 23 mg/dL   Creatinine, Ser 9.02 0.40 - 1.50 mg/dL   Total Bilirubin 0.4 0.2 - 1.2 mg/dL   Alkaline Phosphatase 59 39 - 117 U/L   AST 17 0 - 37 U/L   ALT 19 0 - 53 U/L   Total Protein 7.5 6.0 - 8.3 g/dL   Albumin 4.5 3.5 - 5.2 g/dL   GFR 17.72 >39.99 mL/min   Calcium  9.1 8.4 - 10.5 mg/dL  Lipid panel  Result Value Ref Range   Cholesterol 182 0 - 200 mg/dL   Triglycerides 897.9 0.0 - 149.0 mg/dL   HDL 60.49 >60.99 mg/dL   VLDL 79.5 0.0 - 59.9 mg/dL   LDL Cholesterol 877 (H) 0 - 99 mg/dL   Total CHOL/HDL Ratio 5    NonHDL 142.89   Hemoglobin A1c  Result Value Ref Range   Hgb A1c MFr Bld 7.3 (H) 4.6 - 6.5 %  Microalbumin / creatinine urine ratio  Result Value Ref Range   Microalb, Ur 6.5 (H) 0.0 - 1.9 mg/dL   Creatinine,U 875.1 mg/dL   Microalb Creat Ratio 52.1 (H) 0.0 - 30.0 mg/g    Abstract on 04/09/2024  Component Date Value   HM Diabetic Eye Exam 02/12/2024 No Retinopathy   Office Visit on 04/09/2024  Component Date Value   WBC 04/09/2024 10.3    RBC 04/09/2024 5.32    Hemoglobin 04/09/2024 13.9    HCT 04/09/2024 43.4    MCV 04/09/2024 81.7    MCHC 04/09/2024 32.1    RDW 04/09/2024 16.4 (H)    Platelets 04/09/2024 278.0    Neutrophils Relative % 04/09/2024 69.9    Lymphocytes Relative 04/09/2024 19.7    Monocytes Relative 04/09/2024 8.3    Eosinophils Relative  04/09/2024 1.6    Basophils Relative 04/09/2024 0.5    Neutro Abs 04/09/2024 7.2    Lymphs Abs 04/09/2024 2.0    Monocytes Absolute  04/09/2024 0.8    Eosinophils Absolute 04/09/2024 0.2    Basophils Absolute 04/09/2024 0.1    Sodium 04/09/2024 138    Potassium 04/09/2024 4.3    Chloride 04/09/2024 108    CO2 04/09/2024 22    Glucose, Bld 04/09/2024 140 (H)    BUN 04/09/2024 13    Creatinine, Ser 04/09/2024 0.97    Total Bilirubin 04/09/2024 0.4    Alkaline Phosphatase 04/09/2024 59    AST 04/09/2024 17    ALT 04/09/2024 19    Total Protein 04/09/2024 7.5    Albumin 04/09/2024 4.5    GFR 04/09/2024 82.27    Calcium  04/09/2024 9.1    Cholesterol 04/09/2024 182    Triglycerides 04/09/2024 102.0    HDL 04/09/2024 39.50    VLDL 04/09/2024 20.4    LDL Cholesterol 04/09/2024 122 (H)    Total CHOL/HDL Ratio 04/09/2024 5    NonHDL 04/09/2024 142.89    Hgb A1c MFr Bld 04/09/2024 7.3 (H)    Microalb, Ur 04/09/2024 6.5 (H)    Creatinine,U 04/09/2024 124.8    Microalb Creat Ratio 04/09/2024 52.1 (H)   Admission on 01/02/2024, Discharged on 01/03/2024  Component Date Value   Glucose-Capillary 01/02/2024 127 (H)   Office Visit on 09/08/2023  Component Date Value   Hemoglobin A1C 09/08/2023 7.6 (A)    HbA1c POC (<> result, ma* 09/08/2023 7.6    HbA1c, POC (prediabetic * 09/08/2023 7.6 (A)    HbA1c, POC (controlled d* 09/08/2023 7.6 (A)   Abstract on 08/11/2023  Component Date Value   HIV 1&2 Ab, 4th Generati* 02/17/2022 Negative   Abstract on 08/11/2023  Component Date Value   HM HIV Screening 02/17/2022 Negative - Validated    HM Hepatitis Screen 02/17/2022 Negative-Validated   Office Visit on 08/11/2023  Component Date Value   WBC 08/11/2023 9.0    RBC 08/11/2023 5.30    Hemoglobin 08/11/2023 14.5    HCT 08/11/2023 45.0    MCV 08/11/2023 84.8    MCHC 08/11/2023 32.3    RDW 08/11/2023 16.0 (H)    Platelets 08/11/2023 224.0    Neutrophils Relative % 08/11/2023 64.8    Lymphocytes Relative 08/11/2023 26.0    Monocytes Relative 08/11/2023 6.7    Eosinophils Relative 08/11/2023 1.8    Basophils Relative  08/11/2023 0.7    Neutro Abs 08/11/2023 5.8    Lymphs Abs 08/11/2023 2.3    Monocytes Absolute 08/11/2023 0.6    Eosinophils Absolute 08/11/2023 0.2    Basophils Absolute 08/11/2023 0.1    Sodium 08/11/2023 143    Potassium 08/11/2023 5.2 No hemolysis seen (H)    Chloride 08/11/2023 105    CO2 08/11/2023 30    Glucose, Bld 08/11/2023 202 (H)    BUN 08/11/2023 17    Creatinine, Ser 08/11/2023 1.15    Total Bilirubin 08/11/2023 0.6    Alkaline Phosphatase 08/11/2023 73    AST 08/11/2023 21    ALT 08/11/2023 40    Total Protein 08/11/2023 7.3    Albumin 08/11/2023 4.8    GFR 08/11/2023 67.39    Calcium  08/11/2023 9.9    Iron 08/11/2023 75    TIBC 08/11/2023 353    %SAT 08/11/2023 21    Ferritin 08/11/2023 73    No image results found.   No results found.  No results found.  Assessment & Plan Skin lesion of cheek Refer care to to dermatology per patient request suspicious actinic keratosis.Actinic keratosis of facial skin (suspected)   Rough skin under the eye, present for five years, is concerning for actinic keratosis. Not suspected to be melanoma but could potentially progress to cancer. Refer to dermatology for evaluation and possible treatment with topical chemotherapy. Neck pain Cervical radiculopathy Torticollis, spasmodic DDD (degenerative disc disease), cervical     ASSESSMENT: Degenerative osteoarthritis and degenerative disk disease of vertebral column Clinical Reasoning: Patient presents with localized back pain consistent with degenerative changes. Pain characteristics (location, quality, radiation pattern) and physical exam findings support diagnosis. Pain is mechanical in nature, worsened with activity and improved with rest. Differential Diagnosis Excluded: Bone tumor - No palpable masses, no unexplained weight loss, no pain that worsens at night Acute fracture - No severe trauma, no significant bone loss, no point tenderness over vertebrae Aortic aneurysm -  No hypertension history, no smoking history, absence of tearing/ripping sensations Vertebral disk infection - No fever, no recent infections, no IVDU history Cauda equina syndrome - No weakness/numbness, no loss of bowel or bladder function  RISK ASSESSMENT: Low risk for serious pathology based on absence of red flags. Patient educated on emergency warning signs requiring immediate evaluation. However, do suspect remote cervical disk injury with facial trauma fall 01/02/2024. TREATMENT PLAN   Acute Pain Management Rest for 24-48 hours, then gradual return to activity as tolerated Cold packs for 20 minutes every 2-3 hours for first 48-72 hours Transition to heat therapy after 72 hours if preferred (no sleeping on heating pad) Acetaminophen 650mg  every 6 hours as needed for pain NSAIDs (if not contraindicated): Toradol  tablets instead of ibupren every 6 hours with food and plenty of fluids Muscle relaxant: Cyclobenzaprine  10 mg at bedtime as needed for muscle spasm  Activity Modification Proper body mechanics reviewed for lifting techniques Avoid heavy lifting (>10 lbs) for 2 weeks Gradual return to normal activities as tolerated Maintain good posture during sitting, standing, and sleeping Need memory foam pillows  Rehabilitation Home exercise program instructions provided in After Visit Summary Focus on gentle stretching and core strengthening exercises Physical Therapy referral deferred for comprehensive evaluation and treatment - he will talk to workers compensation case worker Begin exercises once acute pain subsides (typically 3-5 days)  Diagnostics X-ray of cervicalr spine ordered to assess for degenerative changes after Caseworker discussion Advanced imaging not indicated at this time given absence of red flags Will reassess need for further workup based on treatment response  Follow-up Plan Return to clinic in 2-3 weeks if symptoms persist despite treatment Contact office sooner if  symptoms worsen or if red flag symptoms develop Physical therapy to begin within 1-2 weeks if he lets us  know.   RED FLAG WARNING SIGNS - GO TO EMERGENCY ROOM IF YOU DEVELOP: New weakness or numbness in legs Loss of bladder or bowel control Saddle anesthesia (numbness in groin/rectal area) Severe, progressive, or disabling pain despite treatment Fever over 101F with back pain Sudden onset of severe tearing/ripping pain    EVIDENCE-BASED RECOMMENDATIONS: Treatment plan follows current clinical guidelines from the [employer] and [employer]. Early mobility, appropriate pain control, and progressive exercise have demonstrated better outcomes than prolonged rest or early imaging for non-specific back pain without red flags.  Cervical radiculopathy and degenerative disc disease   He presents with acute neck pain radiating down the arm, consistent with cervical radiculopathy, likely due to a pinched nerve from  a cervical disc issue, possibly exacerbated by a fall in May 2025. No red flags for serious conditions like meningitis or arterial tear. A conservative treatment approach is appropriate. Administer Toradol  injection for immediate pain relief. Prescribe cyclobenzaprine  (Flexeril ) for muscle relaxation at bedtime. Advise use of cold packs for 48-72 hours, followed by heat, and recommend a memory foam pillow for neck support. Advise rest and avoidance of heavy lifting. Consider physical therapy and imaging if symptoms persist or worsen. Discuss potential for a worker's compensation claim related to the May 2025 fall. Generalized arthritis He has osteoarthritis with a previous hip replacement and is a candidate for knee replacement. Type 2 diabetes mellitus with hyperglycemia, without long-term current use of insulin (HCC) Type 2 diabetes mellitus with chronic nausea from metformin   Chronic nausea is likely related to metformin use. A1c has been as high as 9.  Medicaid may cover Ozempic , which could improve glycemic control and reduce nausea. Prescribe semaglutide  (Ozempic ) pending insurance approval. Order standard diabetes lab panel. Discuss potential discontinuation of metformin if Ozempic  is effective. Essential hypertension Blood pressure is borderline elevated. Previous lisinopril  use mentioned. Prescribe lisinopril  if blood pressures are high at home. Mixed hyperlipidemia Hyperlipidemia management with previous atorvastatin use. Currently not on statin therapy. Prescribe rosuvastatin  for cholesterol management. Degenerative lumbar spinal stenosis Lumbar facet arthropathy and spinal stenosis   He experiences intermittent back pain with episodes of inability to stand straight, consistent with lumbar facet arthropathy and spinal stenosis. Cigarette nicotine dependence with nicotine-induced disorder He has a long history of smoking with a current reduction to five cigarettes per day. Previous unsuccessful attempt with bupropion for smoking cessation.    ICD-10-CM   1. Skin lesion of cheek  L98.9 Ambulatory referral to Dermatology    2. Neck pain  M54.2 ketorolac  (TORADOL ) injection 60 mg    ketorolac  (TORADOL ) 10 MG tablet    cyclobenzaprine  (FLEXERIL ) 10 MG tablet    methylPREDNISolone  (MEDROL  DOSEPAK) 4 MG TBPK tablet    3. Generalized arthritis  M19.90     4. Cervical radiculopathy  M54.12 ketorolac  (TORADOL ) injection 60 mg    ketorolac  (TORADOL ) 10 MG tablet    cyclobenzaprine  (FLEXERIL ) 10 MG tablet    5. DDD (degenerative disc disease), cervical  M50.30 ketorolac  (TORADOL ) injection 60 mg    ketorolac  (TORADOL ) 10 MG tablet    cyclobenzaprine  (FLEXERIL ) 10 MG tablet    methylPREDNISolone  (MEDROL  DOSEPAK) 4 MG TBPK tablet    6. Torticollis, spasmodic  G24.3     7. Type 2 diabetes mellitus with hyperglycemia, without long-term current use of insulin (HCC)  E11.65 lisinopril  (ZESTRIL ) 5 MG tablet    CBC with Differential/Platelet     Comprehensive metabolic panel with GFR    Lipid panel    Hemoglobin A1c    Microalbumin / creatinine urine ratio    Semaglutide ,0.25 or 0.5MG /DOS, (OZEMPIC , 0.25 OR 0.5 MG/DOSE,) 2 MG/3ML SOPN    8. Essential hypertension  I10 lisinopril  (ZESTRIL ) 5 MG tablet    9. Mixed hyperlipidemia  E78.2 rosuvastatin  (CRESTOR ) 20 MG tablet    10. Degenerative lumbar spinal stenosis  M48.061     11. Cigarette nicotine dependence with nicotine-induced disorder  F17.219      Diagnoses and all orders for this visit: Skin lesion of cheek -     Ambulatory referral to Dermatology Neck pain -     ketorolac  (TORADOL ) injection 60 mg -     ketorolac  (TORADOL ) 10 MG tablet; Take 1 tablet (10 mg  total) by mouth every 6 (six) hours as needed. -     cyclobenzaprine  (FLEXERIL ) 10 MG tablet; Take 1 tablet (10 mg total) by mouth 3 (three) times daily as needed for muscle spasms (sedative.  only take for muscle stiffness at bedtime). -     methylPREDNISolone  (MEDROL  DOSEPAK) 4 MG TBPK tablet; Use as directed. Generalized arthritis Cervical radiculopathy -     ketorolac  (TORADOL ) injection 60 mg -     ketorolac  (TORADOL ) 10 MG tablet; Take 1 tablet (10 mg total) by mouth every 6 (six) hours as needed. -     cyclobenzaprine  (FLEXERIL ) 10 MG tablet; Take 1 tablet (10 mg total) by mouth 3 (three) times daily as needed for muscle spasms (sedative.  only take for muscle stiffness at bedtime). DDD (degenerative disc disease), cervical -     ketorolac  (TORADOL ) injection 60 mg -     ketorolac  (TORADOL ) 10 MG tablet; Take 1 tablet (10 mg total) by mouth every 6 (six) hours as needed. -     cyclobenzaprine  (FLEXERIL ) 10 MG tablet; Take 1 tablet (10 mg total) by mouth 3 (three) times daily as needed for muscle spasms (sedative.  only take for muscle stiffness at bedtime). -     methylPREDNISolone  (MEDROL  DOSEPAK) 4 MG TBPK tablet; Use as directed. Torticollis, spasmodic Type 2 diabetes mellitus with hyperglycemia, without  long-term current use of insulin (HCC) -     lisinopril  (ZESTRIL ) 5 MG tablet; Take 1 tablet (5 mg total) by mouth in the morning. -     CBC with Differential/Platelet -     Comprehensive metabolic panel with GFR -     Lipid panel -     Hemoglobin A1c -     Microalbumin / creatinine urine ratio -     Semaglutide ,0.25 or 0.5MG /DOS, (OZEMPIC , 0.25 OR 0.5 MG/DOSE,) 2 MG/3ML SOPN; Inject 0.25 mg into the skin once a week. Cut metformin back to 500 twice daily when this is started Essential hypertension -     lisinopril  (ZESTRIL ) 5 MG tablet; Take 1 tablet (5 mg total) by mouth in the morning. Mixed hyperlipidemia -     rosuvastatin  (CRESTOR ) 20 MG tablet; Take 1 tablet (20 mg total) by mouth daily. Degenerative lumbar spinal stenosis Cigarette nicotine dependence with nicotine-induced disorder  Recommended follow up: Return in about 3 weeks (around 04/30/2024), or diabetes and lab rv. Future Appointments  Date Time Provider Department Center  04/30/2024  9:20 AM [name], MD [facility]  05/31/2024 12:45 PM Dillingham, Estefana RAMAN, DO [facility] None         Additional notes: This document was synthesized by artificial intelligence ([employer]) using HIPAA-compliant recording of the clinical interaction;   We discussed the use of AI scribe software for clinical note transcription with the patient, who gave verbal consent to proceed.    Additional Info: This encounter employed state-of-the-art, real-time, collaborative documentation. The patient actively reviewed and assisted in updating their electronic medical record on a shared screen, ensuring transparency and facilitating joint problem-solving for the problem list, overview, and plan. This approach promotes accurate, informed care. The treatment plan was discussed and reviewed in detail, including medication safety, potential side effects, and all patient questions. We confirmed understanding and comfort with the plan. Follow-up instructions  were established, including contacting the office for any concerns, returning if symptoms worsen, persist, or new symptoms develop, and precautions for potential emergency department visits.  Initial Appointment Goals:  This initial visit focused on establishing a  foundation for the patient's care. We collaboratively reviewed his medical history and medications in detail, updating the chart as shown in the encounter. Given the extensive information, we prioritized addressing his most pressing concerns, which he reported were: New pt (Pt is present to est care with pcp skin concerns would like referral to dermo. ) and Neck Pain (Neck hurting today was like this when woke up this morning has had in the past but today not getting no better.)  While the complexity of the patient's medical picture may necessitate further evaluation in subsequent visits, we were able to develop a preliminary care plan together. To expedite a comprehensive plan at the next visit, we encouraged the patient to gather relevant medical records from previous providers. This collaborative approach will ensure a more complete understanding of the patient's health and inform the development of a personalized care plan. We look forward to continuing the conversation and working together with the patient on achieving his health goals.   Collaborative Documentation:  Today's encounter utilized real-time, dynamic patient engagement.  Patients actively participate by directly reviewing and assisting in updating their medical records through a shared screen. This transparency empowers patients to visually confirm chart updates made by the healthcare provider.  This collaborative approach facilitates problem management as we jointly update the problem list, problem overview, and assessment/plan. Ultimately, this process enhances chart accuracy and completeness, fostering shared decision-making, patient education, and informed consent for tests and  treatments.  Collaborative Treatment Planning:  Treatment plans were discussed and reviewed in detail.  Explained medication safety and potential side effects.  Encouraged participation and answered all patient questions, confirming understanding and comfort with the plan. Encouraged patient to contact our office if they have any questions or concerns. Agreed on patient returning to office if symptoms worsen, persist, or new symptoms develop.  ----------------------------------------------------- Bernardino KANDICE Cone, MD  04/09/2024 6:48 PM  [facility]:  [phone]

## 2024-04-09 NOTE — Assessment & Plan Note (Signed)
 Type 2 diabetes mellitus with chronic nausea from metformin   Chronic nausea is likely related to metformin use. A1c has been as high as 9. Medicaid may cover Ozempic , which could improve glycemic control and reduce nausea. Prescribe semaglutide  (Ozempic ) pending insurance approval. Order standard diabetes lab panel. Discuss potential discontinuation of metformin if Ozempic  is effective.

## 2024-04-09 NOTE — Assessment & Plan Note (Signed)
 He has osteoarthritis with a previous hip replacement and is a candidate for knee replacement.

## 2024-04-09 NOTE — Assessment & Plan Note (Addendum)
 Refer care to to dermatology per patient request suspicious actinic keratosis.Actinic keratosis of facial skin (suspected)   Rough skin under the eye, present for five years, is concerning for actinic keratosis. Not suspected to be melanoma but could potentially progress to cancer. Refer to dermatology for evaluation and possible treatment with topical chemotherapy.

## 2024-04-09 NOTE — Assessment & Plan Note (Signed)
 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 Pain characteristics (location, quality, radiation pattern) and physical exam findings support diagnosis. Pain is mechanical in nature, worsened with activity and improved with rest. Differential Diagnosis Excluded: Bone tumor - No palpable masses, no unexplained weight loss, no pain that worsens at night Acute fracture - No severe trauma, no significant bone loss, no point tenderness over vertebrae Aortic aneurysm - No hypertension history, no smoking history, absence of tearing/ripping sensations Vertebral disk infection - No fever, no recent infections, no IVDU history Cauda equina syndrome - No weakness/numbness, no loss of bowel or bladder function  RISK ASSESSMENT: Low risk for serious pathology based on absence of red flags. Patient educated on emergency warning signs requiring immediate evaluation. However, do suspect remote cervical disk injury with facial trauma fall 01/02/2024. TREATMENT PLAN   Acute Pain Management Rest for 24-48 hours, then gradual return to activity as tolerated Cold packs for 20 minutes every 2-3 hours for first 48-72 hours Transition to heat therapy after 72 hours if preferred (no sleeping on heating pad) Acetaminophen 650mg  every 6 hours as needed for pain NSAIDs (if not contraindicated): Toradol  tablets instead of ibupren every 6 hours with food and plenty of fluids Muscle relaxant: Cyclobenzaprine  10 mg at bedtime as needed for muscle spasm  Activity Modification Proper body mechanics reviewed for lifting techniques Avoid heavy lifting (>10 lbs) for 2 weeks Gradual return to normal activities as tolerated Maintain good posture during sitting, standing, and sleeping Need memory foam pillows  Rehabilitation Home exercise program instructions provided in After Visit  Summary Focus on gentle stretching and core strengthening exercises Physical Therapy referral deferred for comprehensive evaluation and treatment - he will talk to workers compensation case worker Begin exercises once acute pain subsides (typically 3-5 days)  Diagnostics X-ray of cervicalr spine ordered to assess for degenerative changes after Caseworker discussion Advanced imaging not indicated at this time given absence of red flags Will reassess need for further workup based on treatment response  Follow-up Plan Return to clinic in 2-3 weeks if symptoms persist despite treatment Contact office sooner if symptoms worsen or if red flag symptoms develop Physical therapy to begin within 1-2 weeks if he lets us  know.   RED FLAG WARNING SIGNS - GO TO EMERGENCY ROOM IF YOU DEVELOP: New weakness or numbness in legs Loss of bladder or bowel control Saddle anesthesia (numbness in groin/rectal area) Severe, progressive, or disabling pain despite treatment Fever over 101F with back pain Sudden onset of severe tearing/ripping pain    EVIDENCE-BASED RECOMMENDATIONS: Treatment plan follows current clinical guidelines from the [employer] and [employer]. Early mobility, appropriate pain control, and progressive exercise have demonstrated better outcomes than prolonged rest or early imaging for non-specific back pain without red flags.  Cervical radiculopathy and degenerative disc disease   He presents with acute neck pain radiating down the arm, consistent with cervical radiculopathy, likely due to a pinched nerve from a cervical disc issue, possibly exacerbated by a fall in May 2025. No red flags for serious conditions like meningitis or arterial tear. A conservative treatment approach is appropriate. Administer Toradol  injection for immediate pain relief. Prescribe cyclobenzaprine  (Flexeril ) for muscle relaxation at bedtime. Advise use of cold packs for 48-72 hours,  followed by heat, and recommend a memory foam pillow for neck support. Advise rest and avoidance of heavy lifting. Consider physical therapy and imaging  if symptoms persist or worsen. Discuss potential for a worker's compensation claim related to the May 2025 fall.

## 2024-04-09 NOTE — Assessment & Plan Note (Signed)
 He has a long history of smoking with a current reduction to five cigarettes per day. Previous unsuccessful attempt with bupropion for smoking cessation.

## 2024-04-09 NOTE — Assessment & Plan Note (Signed)
 Lumbar facet arthropathy and spinal stenosis   He experiences intermittent back pain with episodes of inability to stand straight, consistent with lumbar facet arthropathy and spinal stenosis.

## 2024-04-09 NOTE — Assessment & Plan Note (Signed)
 Blood pressure is borderline elevated. Previous lisinopril  use mentioned. Prescribe lisinopril  if blood pressures are high at home.

## 2024-04-10 NOTE — Telephone Encounter (Signed)
 read by Debby JULIANNA Schultze at 4:21AM on 04/10/2024.

## 2024-04-11 ENCOUNTER — Telehealth: Payer: Self-pay

## 2024-04-11 NOTE — Telephone Encounter (Signed)
 Pharmacy Patient Advocate Encounter  Received notification from [facility] [employer] that Prior Authorization for Ozempic  (0.25 or 0.5 MG/DOSE) 2MG /3ML pen-injectors  has been APPROVED from 04/11/24 to 04/11/25   PA #/Case ID/Reference #: [id number]

## 2024-04-11 NOTE — Telephone Encounter (Signed)
 Pharmacy Patient Advocate Encounter   Received notification from [employer] that prior authorization for Ozempic  (0.25 or 0.5 MG/DOSE) 2MG /3ML pen-injectors is required/requested.   Insurance verification completed.   The patient is insured through [facility] [employer] [place] .   Per test claim: PA required; PA submitted to above mentioned insurance via Latent Key/confirmation #/EOC  [id number] Status is pending

## 2024-04-30 ENCOUNTER — Encounter: Payer: Self-pay | Admitting: Internal Medicine

## 2024-04-30 ENCOUNTER — Ambulatory Visit (INDEPENDENT_AMBULATORY_CARE_PROVIDER_SITE_OTHER): Admitting: Internal Medicine

## 2024-04-30 VITALS — BP 126/72 | HR 80 | Temp 98.2°F | Ht 73.0 in | Wt 243.0 lb

## 2024-04-30 DIAGNOSIS — M5412 Radiculopathy, cervical region: Secondary | ICD-10-CM

## 2024-04-30 DIAGNOSIS — Z7984 Long term (current) use of oral hypoglycemic drugs: Secondary | ICD-10-CM

## 2024-04-30 DIAGNOSIS — M1710 Unilateral primary osteoarthritis, unspecified knee: Secondary | ICD-10-CM | POA: Diagnosis not present

## 2024-04-30 DIAGNOSIS — Z1211 Encounter for screening for malignant neoplasm of colon: Secondary | ICD-10-CM

## 2024-04-30 DIAGNOSIS — E1165 Type 2 diabetes mellitus with hyperglycemia: Secondary | ICD-10-CM | POA: Diagnosis not present

## 2024-04-30 MED ORDER — DICLOFENAC SODIUM 1 % EX GEL: 4.0000 g | Freq: Four times a day (QID) | CUTANEOUS | 11 refills | Status: DC | PRN

## 2024-04-30 MED ORDER — DICLOFENAC SODIUM 75 MG PO TBEC: 75.0000 mg | DELAYED_RELEASE_TABLET | Freq: Two times a day (BID) | ORAL | 1 refills | Status: DC

## 2024-04-30 MED ORDER — OZEMPIC (0.25 OR 0.5 MG/DOSE) 2 MG/3ML ~~LOC~~ SOPN: 0.5000 mg | PEN_INJECTOR | SUBCUTANEOUS | 5 refills | Status: DC

## 2024-04-30 NOTE — Progress Notes (Signed)
 ==============================  [facility]: [phone]   -- Medical Office Visit --  Patient: Charles Payne      Age: 63 y.o.       Sex:  male  Date:   04/30/2024 Today's Healthcare Provider: Bernardino KANDICE Cone, MD  ==============================   Chief Complaint: Neck Pain (Neck pain is getting better got new pillow and helping some ) and Medication Problem (Pt states ozempic  is going good has been on it for two weeks now and he is taking metformin half tab one in morning and one in evening. )   Discussed the use of AI scribe software for clinical note transcription with the patient, who gave verbal consent to proceed.  History of Present Illness Charles Payne is a 63 year old male with chronic neck pain who presents for follow-up of persistent neck pain and management of diabetes.  He has experienced persistent neck pain for approximately three weeks, initially presenting with severe pain and limited range of motion. The pain has slightly improved with the use of a memory foam pillow, though it remains 'really sore' and affects his ability to drive. He initially used a prescribed medication for sleep for the first two nights due to pain but has not needed it since. No new weakness, numbness, loss of bowel or bladder control, or other concerning symptoms.  He has a history of a worker's compensation claim related to this neck issue, facing challenges with the insurance company denying coverage. He has not pursued legal action but has considered consulting a Clinical research associate. He has not yet undergone x-rays or physical therapy due to these insurance issues.  He was previously prescribed a six-day course of prednisone but has not yet taken it, considering it for future use if needed. He has tried NSAIDs and Flexeril , with limited relief. A Toradol  injection provided temporary relief for about six hours. He continues to use the memory foam pillow, which has been  beneficial.  He has a history of knee pain and has been a candidate for knee replacement for many years. He has previously received steroid injections for his knee, which provided temporary relief, but these were discontinued due to concerns about long-term use. He is considering knee replacement surgery now that he has Medicare coverage.  He has a history of diabetes and is currently on rosuvastatin  for cholesterol management. He takes metformin, which he has adjusted to 500 mg twice daily since starting Ozempic . He reports a decrease in cravings for sweets since starting Ozempic  and is following a regimen of 0.25 mg weekly injections, planning to increase the dose after four weeks.  He works as Office manager at [employer]. No new weakness, numbness, loss of bowel or bladder control, anesthesia or numbness in the groin or rectum, loss of erection, urinating on himself, high fevers, or sudden onset of tearing or ripping type pains. Reports increased frequency of urination at night, approximately every hour, but is able to return to sleep easily.   Background Reviewed: Problem List: has Type 2 diabetes mellitus with hyperglycemia, without long-term current use of insulin (HCC); Essential tremor; Hyperlipidemia; Nocturia; Personal history of tobacco use; Anemia; Diverticulosis of colon; Muscle cramp, nocturnal; Sensorineural hearing loss (SNHL) of both ears; Primary osteoarthritis of right knee; Essential hypertension; Dry skin dermatitis; Lip laceration; Skin lesion of cheek; Neck pain; DDD (degenerative disc disease), lumbar; Osteoarthritis; Allergic rhinitis; Degenerative lumbar spinal stenosis; Hip impingement syndrome, left; Neurogenic claudication; Nicotine dependence; Nuclear sclerosis of both  eyes; Obesity, unspecified; Obstructive sleep apnea syndrome; Vitamin D deficiency; and 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 Past Medical History:  has a  past medical history of Arthritis, Diabetes (HCC), Lumbar facet arthropathy (02/11/2020), Migraines, Mixed hyperlipidemia, Obesity (BMI 35.0-39.9 without comorbidity) (04/02/2009), Personal history of tobacco use, presenting hazards to health (06/08/2023), Pulmonary embolism (HCC) (02/12/2024), and Tremor of both hands. Past Surgical History:   has a past surgical history that includes Knee arthroscopy w/ debridement (Right) and Partial hip arthroplasty (2021). Social History:   reports that he has been smoking cigarettes. He has never used smokeless tobacco. He reports current alcohol use of about 4.0 standard drinks of alcohol per week. He reports that he does not use drugs. Family History:  family history is not on file. Allergies:  has no known allergies.   Medication Reconciliation: Current Outpatient Medications on File Prior to Visit  Medication Sig   aspirin 81 MG chewable tablet Chew 81 mg by mouth daily.   Blood Glucose Monitoring Suppl DEVI 1 each by Does not apply route in the morning, at noon, and at bedtime. May substitute to any manufacturer covered by patient's insurance.   cyclobenzaprine  (FLEXERIL ) 10 MG tablet Take 1 tablet (10 mg total) by mouth 3 (three) times daily as needed for muscle spasms (sedative.  only take for muscle stiffness at bedtime).   glipiZIDE (GLUCOTROL) 10 MG tablet Take 1 tablet by mouth 2 (two) times daily.   ketorolac  (TORADOL ) 10 MG tablet Take 1 tablet (10 mg total) by mouth every 6 (six) hours as needed.   lisinopril  (ZESTRIL ) 5 MG tablet Take 1 tablet (5 mg total) by mouth in the morning.   metFORMIN (GLUCOPHAGE) 1000 MG tablet TAKE 1 TABLET BY MOUTH TWICE A DAY WITH MORNING AND EVENING MEALS   methylPREDNISolone  (MEDROL  DOSEPAK) 4 MG TBPK tablet Use as directed.   Naltrexone  HCl, Pain, 4.5 MG CAPS Take 1 capsule by mouth daily.   propranolol (INDERAL) 20 MG tablet Take 1 tablet by mouth 2 (two) times daily.   rosuvastatin  (CRESTOR ) 20 MG tablet Take  1 tablet (20 mg total) by mouth daily.   triamcinolone  cream (KENALOG ) 0.5 % Apply 1 Application topically 2 (two) times daily. To affected areas, do not use >14 consecutive days   No current facility-administered medications on file prior to visit.   Medications Discontinued During This Encounter  Medication Reason   Semaglutide ,0.25 or 0.5MG /DOS, (OZEMPIC , 0.25 OR 0.5 MG/DOSE,) 2 MG/3ML SOPN Reorder     Physical Exam:    04/30/2024    9:11 AM 04/09/2024   10:11 AM 03/22/2024    9:50 AM  Vitals with BMI  Height 6' 1 6' 1 6' 1  Weight 243 lbs 243 lbs 10 oz 250 lbs  BMI 32.07 32.15 32.99  Systolic 126 138 855  Diastolic 72 82 71  Pulse 80 66 57  Vital signs reviewed.  Nursing notes reviewed. Weight trend reviewed. Physical Activity: Insufficiently Active (04/05/2024)   Exercise Vital Sign    Days of Exercise per Week: 3 days    Minutes of Exercise per Session: 30 min   General Appearance:  No acute distress appreciable.   Well-groomed, healthy-appearing male.  Well proportioned with no abnormal fat distribution.  Good muscle tone. Pulmonary:  Normal work of breathing at rest, no respiratory distress apparent. SpO2: 98 %  Musculoskeletal: All extremities are intact.  Neurological:  Awake, alert, oriented, and engaged.  No obvious focal neurological  deficits or cognitive impairments.  Sensorium seems unclouded.   Speech is clear and coherent with logical content. Psychiatric:  Appropriate mood, pleasant and cooperative demeanor, thoughtful and engaged during the exam   Verbalized to patient: Physical Exam    Results:   Verbalized to patient: Results      04/09/2024   10:19 AM  PHQ 2/9 Scores  PHQ - 2 Score 0   Abstract on 04/09/2024  Component Date Value Ref Range Status   HM Diabetic Eye Exam 02/12/2024 No Retinopathy  No Retinopathy Final  Office Visit on 04/09/2024  Component Date Value Ref Range Status   WBC 04/09/2024 10.3  4.0 - 10.5 K/uL Final   RBC  04/09/2024 5.32  4.22 - 5.81 Mil/uL Final   Hemoglobin 04/09/2024 13.9  13.0 - 17.0 g/dL Final   HCT 91/80/7974 43.4  39.0 - 52.0 % Final   MCV 04/09/2024 81.7  78.0 - 100.0 fl Final   MCHC 04/09/2024 32.1  30.0 - 36.0 g/dL Final   RDW 91/80/7974 16.4 (H)  11.5 - 15.5 % Final   Platelets 04/09/2024 278.0  150.0 - 400.0 K/uL Final   Neutrophils Relative % 04/09/2024 69.9  43.0 - 77.0 % Final   Lymphocytes Relative 04/09/2024 19.7  12.0 - 46.0 % Final   Monocytes Relative 04/09/2024 8.3  3.0 - 12.0 % Final   Eosinophils Relative 04/09/2024 1.6  0.0 - 5.0 % Final   Basophils Relative 04/09/2024 0.5  0.0 - 3.0 % Final   Neutro Abs 04/09/2024 7.2  1.4 - 7.7 K/uL Final   Lymphs Abs 04/09/2024 2.0  0.7 - 4.0 K/uL Final   Monocytes Absolute 04/09/2024 0.8  0.1 - 1.0 K/uL Final   Eosinophils Absolute 04/09/2024 0.2  0.0 - 0.7 K/uL Final   Basophils Absolute 04/09/2024 0.1  0.0 - 0.1 K/uL Final   Sodium 04/09/2024 138  135 - 145 mEq/L Final   Potassium 04/09/2024 4.3  3.5 - 5.1 mEq/L Final   Chloride 04/09/2024 108  96 - 112 mEq/L Final   CO2 04/09/2024 22  19 - 32 mEq/L Final   Glucose, Bld 04/09/2024 140 (H)  70 - 99 mg/dL Final   BUN 91/80/7974 13  6 - 23 mg/dL Final   Creatinine, Ser 04/09/2024 0.97  0.40 - 1.50 mg/dL Final   Total Bilirubin 04/09/2024 0.4  0.2 - 1.2 mg/dL Final   Alkaline Phosphatase 04/09/2024 59  39 - 117 U/L Final   AST 04/09/2024 17  0 - 37 U/L Final   ALT 04/09/2024 19  0 - 53 U/L Final   Total Protein 04/09/2024 7.5  6.0 - 8.3 g/dL Final   Albumin 91/80/7974 4.5  3.5 - 5.2 g/dL Final   GFR 91/80/7974 82.27  >60.00 mL/min Final   Calcium  04/09/2024 9.1  8.4 - 10.5 mg/dL Final   Cholesterol 91/80/7974 182  0 - 200 mg/dL Final   Triglycerides 91/80/7974 102.0  0.0 - 149.0 mg/dL Final   HDL 91/80/7974 39.50  >39.00 mg/dL Final   VLDL 91/80/7974 20.4  0.0 - 40.0 mg/dL Final   LDL Cholesterol 04/09/2024 122 (H)  0 - 99 mg/dL Final   Total CHOL/HDL Ratio 04/09/2024 5    Final   NonHDL 04/09/2024 142.89   Final   Hgb A1c MFr Bld 04/09/2024 7.3 (H)  4.6 - 6.5 % Final   Microalb, Ur 04/09/2024 6.5 (H)  0.0 - 1.9 mg/dL Final   Creatinine,U 91/80/7974 124.8  mg/dL Final   Microalb Creat  Ratio 04/09/2024 52.1 (H)  0.0 - 30.0 mg/g Final  Admission on 01/02/2024, Discharged on 01/03/2024  Component Date Value Ref Range Status   Glucose-Capillary 01/02/2024 127 (H)  70 - 99 mg/dL Final  Office Visit on 09/08/2023  Component Date Value Ref Range Status   Hemoglobin A1C 09/08/2023 7.6 (A)  4.0 - 5.6 % Final   HbA1c POC (<> result, manual entry) 09/08/2023 7.6  4.0 - 5.6 % Final   HbA1c, POC (prediabetic range) 09/08/2023 7.6 (A)  5.7 - 6.4 % Final   HbA1c, POC (controlled diabetic ra* 09/08/2023 7.6 (A)  0.0 - 7.0 % Final  Abstract on 08/11/2023  Component Date Value Ref Range Status   HIV 1&2 Ab, 4th Generation 02/17/2022 Negative   Final  Abstract on 08/11/2023  Component Date Value Ref Range Status   HM HIV Screening 02/17/2022 Negative - Validated   Final   HM Hepatitis Screen 02/17/2022 Negative-Validated   Final  Office Visit on 08/11/2023  Component Date Value Ref Range Status   WBC 08/11/2023 9.0  4.0 - 10.5 K/uL Final   RBC 08/11/2023 5.30  4.22 - 5.81 Mil/uL Final   Hemoglobin 08/11/2023 14.5  13.0 - 17.0 g/dL Final   HCT 87/79/7975 45.0  39.0 - 52.0 % Final   MCV 08/11/2023 84.8  78.0 - 100.0 fl Final   MCHC 08/11/2023 32.3  30.0 - 36.0 g/dL Final   RDW 87/79/7975 16.0 (H)  11.5 - 15.5 % Final   Platelets 08/11/2023 224.0  150.0 - 400.0 K/uL Final   Neutrophils Relative % 08/11/2023 64.8  43.0 - 77.0 % Final   Lymphocytes Relative 08/11/2023 26.0  12.0 - 46.0 % Final   Monocytes Relative 08/11/2023 6.7  3.0 - 12.0 % Final   Eosinophils Relative 08/11/2023 1.8  0.0 - 5.0 % Final   Basophils Relative 08/11/2023 0.7  0.0 - 3.0 % Final   Neutro Abs 08/11/2023 5.8  1.4 - 7.7 K/uL Final   Lymphs Abs 08/11/2023 2.3  0.7 - 4.0 K/uL Final   Monocytes  Absolute 08/11/2023 0.6  0.1 - 1.0 K/uL Final   Eosinophils Absolute 08/11/2023 0.2  0.0 - 0.7 K/uL Final   Basophils Absolute 08/11/2023 0.1  0.0 - 0.1 K/uL Final   Sodium 08/11/2023 143  135 - 145 mEq/L Final   Potassium 08/11/2023 5.2 No hemolysis seen (H)  3.5 - 5.1 mEq/L Final   Chloride 08/11/2023 105  96 - 112 mEq/L Final   CO2 08/11/2023 30  19 - 32 mEq/L Final   Glucose, Bld 08/11/2023 202 (H)  70 - 99 mg/dL Final   BUN 87/79/7975 17  6 - 23 mg/dL Final   Creatinine, Ser 08/11/2023 1.15  0.40 - 1.50 mg/dL Final   Total Bilirubin 08/11/2023 0.6  0.2 - 1.2 mg/dL Final   Alkaline Phosphatase 08/11/2023 73  39 - 117 U/L Final   AST 08/11/2023 21  0 - 37 U/L Final   ALT 08/11/2023 40  0 - 53 U/L Final   Total Protein 08/11/2023 7.3  6.0 - 8.3 g/dL Final   Albumin 87/79/7975 4.8  3.5 - 5.2 g/dL Final   GFR 87/79/7975 67.39  >60.00 mL/min Final   Calcium  08/11/2023 9.9  8.4 - 10.5 mg/dL Final   Iron 87/79/7975 75  50 - 180 mcg/dL Final   TIBC 87/79/7975 353  250 - 425 mcg/dL (calc) Final   %SAT 87/79/7975 21  20 - 48 % (calc) Final   Ferritin 08/11/2023 73  24 - 380 ng/mL Final  No image results found. No results found.       ASSESSMENT & PLAN   Assessment & Plan Cervical radiculopathy Cervical radiculopathy with neck pain   Chronic neck pain with cervical radiculopathy affects the right side, with pain radiating down the right arm. There are no red flag symptoms such as weakness, numbness, or loss of bowel/bladder control. Conservative management is appropriate as the condition is nonmalignant and does not require urgent surgical intervention. Advise using a memory foam pillow and performing home physical therapy exercises. Provide handouts for cervical traction and neurodynamic techniques. Prescribe diclofenac  for pain management in both oral and topical forms. Recommend heating pads and massage for symptomatic relief. Discuss the potential use of prednisone as a test dose for  inflammatory pain. Consider referral to pain management for steroid injections if symptoms worsen. Screening for malignant neoplasm of colon Colonoscopy screening is overdue, last performed in 2009. Referral to gastroenterology for colonoscopy is appropriate. Arthritis of knee Primary osteoarthritis of the knee   Chronic knee pain due to osteoarthritis has been temporarily relieved by steroid injections. He is a candidate for knee replacement surgery, now feasible with Medicare coverage. Refer to an orthopedic surgeon for knee replacement evaluation. Prescribe diclofenac  topical rub for localized pain relief. Type 2 diabetes mellitus with hyperglycemia, without long-term current use of insulin (HCC) Type 2 diabetes is managed with metformin and semaglutide  (Ozempic ). The current regimen includes metformin 500 mg BID and semaglutide  0.25 mg weekly. Plan to increase the semaglutide  dose after four weeks and discontinue metformin. He reports decreased cravings for sweets, indicating a positive response to semaglutide . Continue semaglutide  0.25 mg weekly for four weeks, then increase to 0.5 mg weekly. Discontinue metformin after increasing the semaglutide  dose. Monitor blood glucose levels and adjust treatment as needed.   Follow-Up   Plan to monitor progress and adjust treatment as needed. Coordinate with gastroenterology and orthopedic surgery for upcoming appointments. Schedule a follow-up appointment in five weeks to reassess neck pain, diabetes management, and overall progress.  ORDER ASSOCIATIONS  #   DIAGNOSIS / CONDITION ICD-10 ENCOUNTER ORDER     ICD-10-CM   1. Cervical radiculopathy  M54.12 diclofenac  Sodium (VOLTAREN ) 1 % GEL    diclofenac  (VOLTAREN ) 75 MG EC tablet    2. Screening for malignant neoplasm of colon  Z12.11 Ambulatory referral to Gastroenterology    3. Arthritis of knee  M17.10 diclofenac  Sodium (VOLTAREN ) 1 % GEL    diclofenac  (VOLTAREN ) 75 MG EC tablet    Ambulatory  referral to Orthopedic Surgery    4. Type 2 diabetes mellitus with hyperglycemia, without long-term current use of insulin (HCC)  E11.65 Semaglutide ,0.25 or 0.5MG /DOS, (OZEMPIC , 0.25 OR 0.5 MG/DOSE,) 2 MG/3ML SOPN           Orders Placed in Encounter:    Referral Orders         Ambulatory referral to Gastroenterology         Ambulatory referral to Orthopedic Surgery     Meds ordered this encounter  Medications   diclofenac  Sodium (VOLTAREN ) 1 % GEL    Sig: Apply 4 g topically 4 (four) times daily as needed.    Dispense:  100 g    Refill:  11   diclofenac  (VOLTAREN ) 75 MG EC tablet    Sig: Take 1 tablet (75 mg total) by mouth 2 (two) times daily.    Dispense:  60 tablet    Refill:  1   Semaglutide ,0.25 or  0.5MG /DOS, (OZEMPIC , 0.25 OR 0.5 MG/DOSE,) 2 MG/3ML SOPN    Sig: Inject 0.5 mg into the skin once a week. Stop metformin when this is started after 0.25 mg x 4 weeks has been completed.    Dispense:  3 mL    Refill:  5    Prior authorization - chronic nausea from metformin.       This document was synthesized by artificial intelligence ([employer]) using HIPAA-compliant recording of the clinical interaction;   We discussed the use of AI scribe software for clinical note transcription with the patient, who gave verbal consent to proceed. additional Info: This encounter employed state-of-the-art, real-time, collaborative documentation. The patient actively reviewed and assisted in updating their electronic medical record on a shared screen, ensuring transparency and facilitating joint problem-solving for the problem list, overview, and plan. This approach promotes accurate, informed care. The treatment plan was discussed and reviewed in detail, including medication safety, potential side effects, and all patient questions. We confirmed understanding and comfort with the plan. Follow-up instructions were established, including contacting the office for any concerns, returning if symptoms  worsen, persist, or new symptoms develop, and precautions for potential emergency department visits.

## 2024-04-30 NOTE — Assessment & Plan Note (Signed)
 Type 2 diabetes is managed with metformin and semaglutide  (Ozempic ). The current regimen includes metformin 500 mg BID and semaglutide  0.25 mg weekly. Plan to increase the semaglutide  dose after four weeks and discontinue metformin. He reports decreased cravings for sweets, indicating a positive response to semaglutide . Continue semaglutide  0.25 mg weekly for four weeks, then increase to 0.5 mg weekly. Discontinue metformin after increasing the semaglutide  dose. Monitor blood glucose levels and adjust treatment as needed.

## 2024-04-30 NOTE — Patient Instructions (Addendum)
 It was a pleasure seeing you today! Your health and satisfaction are our top priorities.  Charles Cone, MD  VISIT SUMMARY: You came in today for a follow-up on your persistent neck pain and to manage your diabetes. We discussed your ongoing neck pain, which has slightly improved with the use of a memory foam pillow but still affects your daily activities. We also reviewed your diabetes management and made some adjustments to your medication. Additionally, we talked about your chronic knee pain and the possibility of knee replacement surgery now that you have Medicare coverage.  YOUR PLAN: -CERVICAL RADICULOPATHY WITH NECK PAIN: Cervical radiculopathy is a condition where a nerve in the neck is pinched, causing pain that can radiate down the arm. We recommend continuing to use your memory foam pillow and performing home physical therapy exercises. We provided handouts for cervical traction and neurodynamic techniques. You are prescribed diclofenac  for pain management in both oral and topical forms. Using heating pads and getting massages may also help. If needed, you can try a test dose of prednisone for inflammatory pain. If your symptoms worsen, we may refer you to pain management for steroid injections.  -PRIMARY OSTEOARTHRITIS OF THE KNEE: Osteoarthritis is a condition where the cartilage in the knee wears down over time, causing pain and stiffness. You are a candidate for knee replacement surgery, which is now feasible with your Medicare coverage. We will refer you to an orthopedic surgeon for an evaluation. In the meantime, you are prescribed diclofenac  topical rub for localized pain relief.  -TYPE 2 DIABETES MELLITUS WITH HYPERGLYCEMIA: Type 2 diabetes is a condition where your body does not use insulin properly, leading to high blood sugar levels. Your diabetes is currently managed with metformin and semaglutide  (Ozempic ). You will continue with metformin 500 mg twice daily and semaglutide  0.25 mg  weekly for four weeks. After that, we will increase the semaglutide  dose to 0.5 mg weekly and discontinue metformin. Please monitor your blood glucose levels and adjust your treatment as needed.  -MIXED HYPERLIPIDEMIA: Mixed hyperlipidemia is a condition where you have high levels of different types of fats in your blood. This is managed with rosuvastatin , which you should continue taking as prescribed.  -ESSENTIAL HYPERTENSION: Essential hypertension is high blood pressure with no identifiable cause. Your blood pressure was good today, so continue with your current management plan.  -GENERAL HEALTH MAINTENANCE: You are overdue for a colonoscopy, as your last one was in 2009. We will refer you to gastroenterology for this screening. Additionally, we will monitor your progress and adjust your treatment as needed.  INSTRUCTIONS: Please follow up in five weeks to reassess your neck pain, diabetes management, and overall progress. Coordinate with gastroenterology for your colonoscopy and with orthopedic surgery for your knee replacement evaluation.  Your Providers PCP: Payne Charles MATSU, MD,  [phone]) Referring Provider: Cone Charles MATSU, MD,  [phone])  NEXT STEPS: [x]  Early Intervention: Schedule sooner appointment, call our on-call services, or go to emergency room if there is any significant Increase in pain or discomfort New or worsening symptoms Sudden or severe changes in your health [x]  Flexible Follow-Up: We recommend a Return in about 6 weeks (around 06/11/2024) for diabetes follow up. for optimal routine care. This allows for progress monitoring and treatment adjustments. [x]  Preventive Care: Schedule your annual preventive care visit! It's typically covered by insurance and helps identify potential health issues early. [x]  Lab & X-ray Appointments: Incomplete tests scheduled today, or call to schedule. X-rays: [facility] (M-F, 8:30am-noon or 1pm-5pm). [x]   Medical Information Release: Sign a release form at front desk to obtain relevant medical information we don't have.  MAKING THE MOST OF OUR FOCUSED 20 MINUTE APPOINTMENTS: [x]   Clearly state your top concerns at the beginning of the visit to focus our discussion [x]   If you anticipate you will need more time, please inform the front desk during scheduling - we can book multiple appointments in the same week. [x]   If you have transportation problems- use our convenient video appointments or ask about transportation support. [x]   We can get down to business faster if you use MyChart to update information before the visit and submit non-urgent questions before your visit. Thank you for taking the time to provide details through MyChart.  Let our nurse know and she can import this information into your encounter documents.  Arrival and Wait Times: [x]   Arriving on time ensures that everyone receives prompt attention. [x]   Early morning (8a) and afternoon (1p) appointments tend to have shortest wait times. [x]   Unfortunately, we cannot delay appointments for late arrivals or hold slots during phone calls.  Getting Answers and Following Up [x]   Simple Questions & Concerns: For quick questions or basic follow-up after your visit, reach us  at [phone] or MyChart messaging. [x]   Complex Concerns: If your concern is more complex, scheduling an appointment might be best. Discuss this with the staff to find the most suitable option. [x]   Lab & Imaging Results: We'll contact you directly if results are abnormal or you don't use MyChart. Most normal results will be on MyChart within 2-3 business days, with a review message from Dr. [name]. Haven't heard back in 2 weeks? Need results sooner? Contact us  at [phone]. [x]   Referrals: Our referral coordinator will manage specialist referrals. The specialist's office should contact you within 2 weeks to schedule an appointment. Call us  if you haven't  heard from them after 2 weeks.  Staying Connected [x]   MyChart: Activate your MyChart for the fastest way to access results and message us . See the last page of this paperwork for instructions on how to activate.  Bring to Your Next Appointment [x]   Medications: Please bring all your medication bottles to your next appointment to ensure we have an accurate record of your prescriptions. [x]   Health Diaries: If you're monitoring any health conditions at home, keeping a diary of your readings can be very helpful for discussions at your next appointment.  Billing [x]   X-ray & Lab Orders: These are billed by separate companies. Contact the invoicing company directly for questions or concerns. [x]   Visit Charges: Discuss any billing inquiries with our administrative services team.  Your Satisfaction Matters [x]   Share Your Experience: We strive for your satisfaction! If you have any complaints, or preferably compliments, please let Dr. [name] know directly or contact our Practice Administrators, Manuelita Rubin or [employer], by asking at the front desk.   Reviewing Your Records [x]   Review this early draft of your clinical encounter notes below and the final encounter summary tomorrow on MyChart after its been completed.  All orders placed so far are visible here: Cervical radiculopathy -     Diclofenac  Sodium; Apply 4 g topically 4 (four) times daily as needed.  Dispense: 100 g; Refill: 11 -     Diclofenac  Sodium; Take 1 tablet (75 mg total) by mouth 2 (two) times daily.  Dispense: 60 tablet; Refill: 1  Screening for malignant neoplasm of  colon -     Ambulatory referral to Gastroenterology  Arthritis of knee -     Diclofenac  Sodium; Apply 4 g topically 4 (four) times daily as needed.  Dispense: 100 g; Refill: 11 -     Diclofenac  Sodium; Take 1 tablet (75 mg total) by mouth 2 (two) times daily.  Dispense: 60 tablet; Refill: 1 -     Ambulatory referral to Orthopedic Surgery  Type 2  diabetes mellitus with hyperglycemia, without long-term current use of insulin (HCC) -     Ozempic  (0.25 or 0.5 MG/DOSE); Inject 0.5 mg into the skin once a week. Stop metformin when this is started after 0.25 mg x 4 weeks has been completed.  Dispense: 3 mL; Refill: 5         Home Therapy for Neck Pain  Safe Home Physical Therapy for Cervical Radiculopathy  Cervical radiculopathy is a condition where a nerve in the neck is pinched or irritated, causing pain, numbness, or tingling that can travel down the arm. Most people improve with time and simple treatments. Two types of physical therapy that can help are cervical traction and neurodynamic (nerve mobilization) techniques. This handout explains how to safely try these at home.  --- 1. Cervical Traction   What is it?  Cervical traction gently stretches the neck, which can help relieve pressure on the nerves.  How to do it safely at home:  - Use only a device made for home use (like an over-the-door traction kit) and follow the instructions that come with it.  - Start with a low weight (usually 8-12 pounds) and never exceed the recommended amount.  - Sit comfortably and keep your head in a neutral position (not tilted or twisted).  - Apply traction for 10-15 minutes, once or twice a day.  - Stop immediately if you feel increased pain, dizziness, numbness, or weakness.  - Do not use traction if you have severe neck instability, recent neck injury, or other serious medical conditions unless cleared by a healthcare provider.  Evidence:  Studies show that cervical traction, when combined with other exercises, can help reduce pain and improve function in people with cervical radiculopathy.[1][2][3][4][5]  --- 2. Neurodynamic (Nerve Mobilization) Techniques   What is it?  These are gentle movements that help the nerves in your arm and neck move more freely, which can reduce pain and improve movement.  How to do it safely at  home:  - The most common exercise is the nerve slider for the arm:  1. Sit or stand up straight.  2. Raise your affected arm out to the side, with your elbow straight and palm facing up.  3. Gently bend your wrist so your fingers point toward the floor.  4. Slowly tilt your head away from the raised arm while lifting your hand slightly (as if you are sliding the nerve).  5. Return to the starting position.  6. Repeat 5-10 times, once or twice a day.  - Movements should be gentle and never cause sharp pain. You may feel a mild stretch or tingling, but stop if you feel pain, numbness, or weakness.  - Do not force any movement. If unsure, ask a physical therapist to demonstrate.  Evidence:  Recent research shows that neurodynamic techniques are among the most effective physical therapy options for reducing pain and disability in cervical radiculopathy.[1][6]  --- General Tips   - Consistency matters: Do these exercises regularly, but do not overdo it.  - Listen to your  body: If symptoms get worse, stop and contact your healthcare provider.  - Combine with other care: Using a supportive pillow, avoiding heavy lifting, and taking medications as prescribed can help your recovery.[7][8][3][9]  ---  When to Seek Help  - New or worsening weakness in your arm or hand  - Loss of coordination or balance  - Severe or sudden pain  - Numbness that does not go away  ---  These exercises are safe for most people with cervical radiculopathy, but always check with your healthcare provider before starting any new therapy, especially if you have other health conditions. References What Components and Formats of Rehabilitation Interventions Are More Effective to Reduce Pain in Patients With Cervical Radiculopathy? A Systematic Review and Component Network Meta-Analysis. Nez de Arenas-Arroyo S, Mavridis D, Martnez-Vizcano V, et al. Clinical Rehabilitation. 2025;:[id number].  doi:10.1177/02692155251365193. Effect of an Intensive Cervical Traction Protocol on Mid-Term Disability and Pain in Patients With Cervical Radiculopathy: An Exploratory, Prospective, Observational Pilot Study. Rulleau T, Abeille S, Pastor L, et al. PloS One. 2021;16(8):e0255998. doi:10.1371/journal.pone.9744001. Neck Pain: Revision 2017. Blanpied PR, Kapfer AR, Viktoria COVER, et al. The Journal of Orthopaedic and Sports Physical Therapy. 2017;47(7):A1-A83. doi:10.2519/jospt.2017.0302. Cervical Radiculopathy: Effectiveness of Adding Traction to Physical Therapy-a Systematic Review and Meta-Analysis of Randomized Controlled Trials. Romeo A, Vanti C, Boldrini V, et al. Physical Therapy. 2018;98(4):231-242. doi:10.1093/physth/pzy001. Manual Therapy in Cervical and Lumbar Radiculopathy: A Systematic Review of the Literature. Lorri DASEN, Skrzek A, Cie?lik B. International Journal of Pharmacologist. 2021;18(11):6176. doi:10.3390/ijerph18116176. Comparison of Neural Mobilization and Conservative Treatment on Pain, Range of Motion, and Disability in Cervical Radiculopathy: A Randomized Controlled Trial. Rafiq S, Zafar H, Gillani SA, et al. PloS One. 2022;17(12):e0278177. doi:10.1371/journal.pone.9721822. Degenerative Cervical Spondylosis. Ricardo BROCKS The [place] Journal of Medicine. 2020;383(2):159-168. doi:10.1056/NEJMra2003558. Nonoperative Management of Cervical Radiculopathy. Arnaldo KILLIAN, Alben BA. American Family Physician. 2016;93(9):746-54. Cervical Radiculopathy. Carette S, Fehlings MG. The [place] Journal of Medicine. 2005;353(4):392-9. doi:10.1056/NEJMcp043887.

## 2024-05-01 ENCOUNTER — Other Ambulatory Visit: Payer: Self-pay | Admitting: Internal Medicine

## 2024-05-01 DIAGNOSIS — M1710 Unilateral primary osteoarthritis, unspecified knee: Secondary | ICD-10-CM

## 2024-05-01 DIAGNOSIS — M5412 Radiculopathy, cervical region: Secondary | ICD-10-CM

## 2024-05-03 DIAGNOSIS — Z419 Encounter for procedure for purposes other than remedying health state, unspecified: Secondary | ICD-10-CM | POA: Diagnosis not present

## 2024-05-14 ENCOUNTER — Ambulatory Visit: Admitting: Orthopaedic Surgery

## 2024-05-14 ENCOUNTER — Telehealth: Payer: Self-pay

## 2024-05-14 ENCOUNTER — Telehealth: Payer: Self-pay | Admitting: Internal Medicine

## 2024-05-14 ENCOUNTER — Other Ambulatory Visit (INDEPENDENT_AMBULATORY_CARE_PROVIDER_SITE_OTHER): Payer: Self-pay

## 2024-05-14 DIAGNOSIS — M1711 Unilateral primary osteoarthritis, right knee: Secondary | ICD-10-CM | POA: Diagnosis not present

## 2024-05-14 DIAGNOSIS — E1165 Type 2 diabetes mellitus with hyperglycemia: Secondary | ICD-10-CM

## 2024-05-14 MED ORDER — GLIPIZIDE 10 MG PO TABS: 10.0000 mg | ORAL_TABLET | Freq: Two times a day (BID) | ORAL | 4 refills | Status: AC

## 2024-05-14 NOTE — Telephone Encounter (Signed)
 Clearance form given to patient to take to PCP. Patient aware we will need form back in order to schedule surgery (R) TKA.

## 2024-05-14 NOTE — Progress Notes (Signed)
 Office Visit Note   Patient: Charles Payne           Date of Birth: 02-18-1961           MRN: [id number] Visit Date: 05/14/2024              Requested by: [name], MD [address] [place],  [place] [address] PCP: [name], MD   Assessment & Plan: Visit Diagnoses:  1. Primary osteoarthritis of right knee     Plan: History of Present Illness Charles Payne is a 63 year old male with a history of right knee surgeries who presents with right knee pain and limited mobility.  He has undergone multiple surgeries on the right knee, including arthroscopic procedures in the mid-1990s and 2005. He experiences difficulty with activities such as golfing and walking, requiring a day or two of recovery post-exercise. Cortisone injections offer temporary relief, making the knee feel significantly improved for a few weeks.  He manages type 2 diabetes with Ozempic  weekly, with a recent A1c of 7.3, down from 7.8. He smokes approximately ten cigarettes daily, having previously quit for a couple of years.  His medical history includes a hip replacement due to arthritis and treatment for a blood clot in 2015 with blood thinners.  Results LABS A1c: 7.3  RADIOLOGY Right knee X-ray: Arthritic changes, bone on bone (05/14/2024) Left knee X-ray: Normal appearance (05/14/2024)  Assessment and Plan Right knee osteoarthritis Chronic severe right knee osteoarthritis with significant degeneration. X-rays confirm severe arthritis. Previous surgeries noted. He is an excellent candidate for knee replacement surgery. No nickel allergy. History of blood clot in 2015. Currently smokes ten cigarettes daily. Aware of recovery challenges and prepared for post-surgery physical therapy. - Obtain surgical clearance from primary care physician. - Schedule knee replacement surgery around Thanksgiving, pending clearance. - Provide educational handout on knee replacement surgery. - Detailed surgical  plan discussed  Impression is severe right knee degenerative joint disease secondary to Osteoarthritis.  Patient has attempted conservative treatment for at least 6 consecutive weeks within the past 12 weeks, including but not limited to physical therapy, home exercise program, NSAIDs, activity modification, and/or corticosteroid injections. Despite these efforts, symptoms have not improved or have worsened. Conservative measures have been deemed unsuccessful at this time. After a detailed discussion covering diagnosis and treatment options--including the risks, benefits, alternatives, and potential complications of surgical and nonsurgical management--the patient elected to proceed with surgery  Anticoagulants: No antithrombotic Postop anticoagulation: Eliquis Diabetic: Yes 7.3 A1c Nickel allergy: No Prior DVT/PE: Yes Tobacco use: Yes Clearances needed for surgery: PCP Anticipated discharge dispo: Home   Follow-Up Instructions: No follow-ups on file.   Orders:  Orders Placed This Encounter  Procedures   XR KNEE 3 VIEW RIGHT   No orders of the defined types were placed in this encounter.     Procedures: No procedures performed   Clinical Data: No additional findings.   Subjective: Chief Complaint  Patient presents with   Right Knee - Pain    HPI  Review of Systems  Constitutional: Negative.   HENT: Negative.    Eyes: Negative.   Respiratory: Negative.    Cardiovascular: Negative.   Gastrointestinal: Negative.   Endocrine: Negative.   Genitourinary: Negative.   Skin: Negative.   Allergic/Immunologic: Negative.   Neurological: Negative.   Hematological: Negative.   Psychiatric/Behavioral: Negative.    All other systems reviewed and are negative.    Objective: Vital Signs: There were no  vitals taken for this visit.  Physical Exam Vitals and nursing note reviewed.  Constitutional:      Appearance: He is well-developed.  HENT:     Head: Normocephalic and  atraumatic.  Eyes:     Pupils: Pupils are equal, round, and reactive to light.  Pulmonary:     Effort: Pulmonary effort is normal.  Abdominal:     Palpations: Abdomen is soft.  Musculoskeletal:        General: Normal range of motion.     Cervical back: Neck supple.  Skin:    General: Skin is warm.  Neurological:     Mental Status: He is alert and oriented to person, place, and time.  Psychiatric:        Behavior: Behavior normal.        Thought Content: Thought content normal.        Judgment: Judgment normal.     Ortho Exam  Specialty Comments:  No specialty comments available.  Imaging: XR KNEE 3 VIEW RIGHT Result Date: 05/14/2024 X-rays demonstrate severe tricompartmental osteoarthritis.  Bone-on-bone joint space narrowing.  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 (HCC) 04/09/2024   Lip laceration 03/22/2024   Nicotine dependence 02/12/2024   Nuclear sclerosis of both eyes 02/12/2024   Obstructive sleep apnea syndrome 02/12/2024   Dry skin dermatitis 09/08/2023   Personal history of tobacco use 08/11/2023   Anemia 08/11/2023   Diverticulosis of colon 08/11/2023   Muscle cramp, nocturnal 08/11/2023   Sensorineural hearing loss (SNHL) of both ears 08/11/2023   Primary osteoarthritis of right knee 08/11/2023   Essential hypertension 08/11/2023   Nocturia 06/08/2023   Essential tremor 03/31/2022   Type 2 diabetes mellitus with hyperglycemia, without long-term current use of insulin (HCC) 02/17/2022   Hyperlipidemia 02/17/2022   Vitamin D deficiency 02/17/2022   Degenerative lumbar spinal stenosis 05/22/2020   DDD (degenerative disc disease), lumbar 02/11/2020   Hip impingement syndrome, left 02/07/2020   Neurogenic claudication 02/07/2020   Osteoarthritis 04/02/2009   Allergic  rhinitis 04/02/2009   Obesity, unspecified 04/02/2009   Past Medical History:  Diagnosis Date   Arthritis    Diabetes (HCC)    Lumbar facet arthropathy 02/11/2020   Migraines    Mixed hyperlipidemia    Obesity (BMI 35.0-39.9 without comorbidity) 04/02/2009   Formatting of this note might be different from the original. Obesity 10/1 IMO update  Formatting of this note might be different from the original. Obesity 10/1 IMO update     Personal history of tobacco use, presenting hazards to health 06/08/2023   Pulmonary embolism (HCC) 02/12/2024   Tremor of both hands     No family history on file.  Past Surgical History:  Procedure Laterality Date   KNEE ARTHROSCOPY W/ DEBRIDEMENT Right    PARTIAL HIP ARTHROPLASTY  2021   Social History   Occupational History   Not on file  Tobacco Use   Smoking status: Every Day    Types: Cigarettes   Smokeless tobacco: Never  Vaping Use   Vaping status: Never Used  Substance and Sexual Activity   Alcohol use: Yes    Alcohol/week: 4.0 standard drinks of alcohol    Types: 4 Cans of beer per week   Drug use: Never   Sexual activity: Not on file

## 2024-05-14 NOTE — Addendum Note (Signed)
 Addended by: Janyra Barillas G on: 05/14/2024 09:16 PM   Modules accepted: Orders

## 2024-05-14 NOTE — Telephone Encounter (Signed)
 Prescription Request  05/14/2024  LOV: 04/30/2024  What is the name of the medication or equipment?   glipiZIDE  (GLUCOTROL ) 10 MG tablet   Have you contacted your pharmacy to request a refill? No   Which pharmacy would you like this sent to?  [id number] - [place], [place] - [address] [place] [address] Phone: [phone] Fax: [phone]    Patient notified that their request is being sent to the clinical staff for review and that they should receive a response within 2 business days.   Please advise at Mobile [phone] (mobile)

## 2024-05-31 ENCOUNTER — Ambulatory Visit (INDEPENDENT_AMBULATORY_CARE_PROVIDER_SITE_OTHER): Payer: Worker's Compensation | Admitting: Plastic Surgery

## 2024-05-31 VITALS — BP 138/73 | HR 63

## 2024-05-31 DIAGNOSIS — S01511D Laceration without foreign body of lip, subsequent encounter: Secondary | ICD-10-CM | POA: Diagnosis not present

## 2024-05-31 DIAGNOSIS — W19XXXD Unspecified fall, subsequent encounter: Secondary | ICD-10-CM

## 2024-05-31 DIAGNOSIS — S01511A Laceration without foreign body of lip, initial encounter: Secondary | ICD-10-CM

## 2024-05-31 NOTE — Progress Notes (Signed)
   Subjective:    Patient ID: Charles Payne, male    DOB: 07/08/1961, 63 y.o.   MRN: [id number]  The patient is a 63 year old male here for reevaluation of his upper lip.  He was involved in a fall at work back in May.  He sustained a laceration to the upper lip that cross the vermilion.  He was taken to the ER and urgent care.  He was concerned that there may be stitches left at the last appointment and that it was quite firm.  It was an area 4 x 8 mm.  It was raised and red.  On exam today it has done much better with his massaging.  I think it has made a big difference on the upper lip part.  Now on the red part of the lip there is still some swelling there so there may be some excess scar tissue or even a foreign body.  He is gena continue to massage this for the next 3 months.  The area on the the upper lip where the mustache area would is healing well enough that I do not really want to excise the scar if we do not have to.  If that is an option if we had to do it.  Otherwise my recommendations are below.      Review of Systems  Constitutional:  Positive for activity change.  Eyes: Negative.   Respiratory: Negative.    Cardiovascular: Negative.   Endocrine: Negative.   Genitourinary: Negative.   Musculoskeletal: Negative.   Skin:  Positive for color change.       Objective:   Physical Exam Vitals reviewed.  HENT:     Head: Normocephalic.     Mouth/Throat:   Cardiovascular:     Rate and Rhythm: Normal rate.     Pulses: Normal pulses.  Pulmonary:     Effort: Pulmonary effort is normal.  Musculoskeletal:        General: Swelling and deformity present.  Skin:    General: Skin is warm.     Capillary Refill: Capillary refill takes less than 2 seconds.     Findings: Lesion present.  Neurological:     Mental Status: He is alert and oriented to person, place, and time.  Psychiatric:        Mood and Affect: Mood normal.        Behavior: Behavior normal.        Thought  Content: Thought content normal.        Judgment: Judgment normal.         Assessment & Plan:     ICD-10-CM   1. Lip laceration, initial encounter  S01.511A       Pictures were obtained of the patient and placed in the chart with the patient's or guardian's permission.  I think the patient might need a revision of the red part of his upper lip.  I would like him to massage this so we can see how it does over the next few months.  He would benefit greatly from laser to his upper lip.  He would probably need about 3 treatments.  This usually cost about $300-$400 per treatment.  Downtime is very minimal and we could do this in the office and spare him the operating room fees.

## 2024-06-03 ENCOUNTER — Telehealth: Payer: Self-pay

## 2024-06-03 NOTE — Telephone Encounter (Signed)
 Faxed office notes from Dr. Lowery dated 05/31/2024 to [facility] case worker, Tracey Budge.  WC Rjdz#[id number]

## 2024-06-03 NOTE — Telephone Encounter (Signed)
 Received fax confirmation: This message was sent via FAXCOM, a product from [employer]. [email or web address]                    -------Fax Transmission Report-------  To:               Recipient at [phone] Subject:          SECURED Result:           The transmission was successful. Explanation:      All Pages Ok Pages Sent:       5 Connect Time:     2 minutes, 33 seconds Transmit Time:    06/03/2024 12:18 Transfer Rate:    14400 Status Code:      0000 Retry Count:      0 Job Id:           [id number] Unique Id:        [id number] Fax Line:         1 Fax Server:       [email or web address]  Sent to case worker Malvina Ancona, RN, who is also listed on WC previously. Requested on cover sheet to make sure Tracey Budge receives a copy.  Fax number provided from the CW at the time of office visit gave wrong fax# [phone] (verified twice with him).SABRA

## 2024-06-04 ENCOUNTER — Telehealth: Payer: Self-pay

## 2024-06-04 NOTE — Telephone Encounter (Signed)
 Office notes from 05/31/2024 was faxed to the correct number # [phone] to Tracey Budge Claim# [id number]  This message was sent via FAXCOM, a product from [employer]. [email or web address]                    -------Fax Transmission Report-------  To:               Recipient at [phone] Subject:          SECURED Result:           The transmission was successful. Explanation:      All Pages Ok Pages Sent:       4 Connect Time:     1 minutes, 30 seconds Transmit Time:    06/04/2024 16:40 Transfer Rate:    14400 Status Code:      0000 Retry Count:      0 Job Id:           [id number] Unique Id:        [id number] Fax Line:         23 Fax Server:       [employer]

## 2024-06-12 ENCOUNTER — Telehealth: Payer: Self-pay | Admitting: Plastic Surgery

## 2024-06-12 NOTE — Telephone Encounter (Signed)
 Called asking for you specifically

## 2024-06-13 ENCOUNTER — Other Ambulatory Visit: Payer: Self-pay | Admitting: Internal Medicine

## 2024-06-13 ENCOUNTER — Encounter: Payer: Self-pay | Admitting: Internal Medicine

## 2024-06-13 ENCOUNTER — Ambulatory Visit (INDEPENDENT_AMBULATORY_CARE_PROVIDER_SITE_OTHER): Admitting: Internal Medicine

## 2024-06-13 VITALS — BP 136/82 | HR 86 | Temp 98.0°F | Ht 73.0 in | Wt 250.8 lb

## 2024-06-13 DIAGNOSIS — Z01818 Encounter for other preprocedural examination: Secondary | ICD-10-CM

## 2024-06-13 DIAGNOSIS — E118 Type 2 diabetes mellitus with unspecified complications: Secondary | ICD-10-CM

## 2024-06-13 DIAGNOSIS — F17200 Nicotine dependence, unspecified, uncomplicated: Secondary | ICD-10-CM

## 2024-06-13 DIAGNOSIS — I10 Essential (primary) hypertension: Secondary | ICD-10-CM

## 2024-06-13 DIAGNOSIS — E669 Obesity, unspecified: Secondary | ICD-10-CM

## 2024-06-13 DIAGNOSIS — Z7984 Long term (current) use of oral hypoglycemic drugs: Secondary | ICD-10-CM

## 2024-06-13 DIAGNOSIS — Z6833 Body mass index (BMI) 33.0-33.9, adult: Secondary | ICD-10-CM

## 2024-06-13 DIAGNOSIS — Z1211 Encounter for screening for malignant neoplasm of colon: Secondary | ICD-10-CM

## 2024-06-13 MED ORDER — NICOTINE POLACRILEX 2 MG MT LOZG: LOZENGE | OROMUCOSAL | 0 refills | Status: DC

## 2024-06-13 MED ORDER — CONTINUOUS GLUCOSE SENSOR MISC: 1.0000 | 2 refills | Status: DC

## 2024-06-13 MED ORDER — OZEMPIC (0.25 OR 0.5 MG/DOSE) 2 MG/3ML ~~LOC~~ SOPN: 0.5000 mg | PEN_INJECTOR | SUBCUTANEOUS | 3 refills | Status: AC

## 2024-06-13 MED ORDER — PROPRANOLOL HCL ER 60 MG PO CP24
60.0000 mg | ORAL_CAPSULE | Freq: Every day | ORAL | 3 refills | Status: AC
Start: 2024-06-13 — End: ?

## 2024-06-13 MED ORDER — NICOTINE POLACRILEX 2 MG MT GUM: CHEWING_GUM | OROMUCOSAL | 0 refills | Status: DC

## 2024-06-13 MED ORDER — NICOTINE 7 MG/24HR TD PT24: MEDICATED_PATCH | TRANSDERMAL | 0 refills | Status: DC

## 2024-06-13 MED ORDER — NICOTINE 14 MG/24HR TD PT24: MEDICATED_PATCH | TRANSDERMAL | 0 refills | Status: DC

## 2024-06-13 MED ORDER — NICOTINE POLACRILEX 2 MG MT LOZG: LOZENGE | OROMUCOSAL | 0 refills | Status: AC

## 2024-06-13 NOTE — Progress Notes (Signed)
 Patient Care Team: [name], MD as PCP - General (Internal Medicine) Patient:  Charles Payne Today's Healthcare Provider: [name] [name], MD   Chief Complaint:  Requests consultative surgical examination, consent, and appropriate evaluation at the request of Dr. Jerri.  Assessment/Plan:   Today's consultation included a comprehensive preoperative evaluation to assess this patients overall health and determine the appropriateness of proceeding with the planned surgery, right knee arthroplasty, given their current medical status. I emphasized that while we take all necessary precautions to minimize risks, no surgery is entirely without risk, and this evaluation helps us  identify and mitigate potential risks to the greatest extent possible. Together, we explored all available options to ensure the approach aligned with the patient's values and preferences. I used open communication techniques to explain the potential risks and benefits, reviewing the risk calculations detailed in the HPI with the patient. All specific procedural risks listed in the ACS NSQIP risk calculator, along with patient-specific risks, were explicitly explained and considered. The patient was also made aware that unforeseen and unpredictable risks can occur.  To comprehensively assess the patient's perioperative risk, we utilized several validated risk stratification tools.  Based on these assessments, the planned procedure, right knee total knee arthroplasty / replacement is classified as intermediate risk. Considering the patient's overall clinical picture, including tobacco smoker, diabetic with borderline control, obesity and the results of the risk stratification tools (RCRI, NSQIP, and DASI), I have stratified their patient-specific cardiac risk as average.  Therefore, the following recommendations are made:  Surgery can proceed with close monitoring, without requirement for further cardiological evaluation, due to  patient's low risk profile and current guidelines, specifically the 2022 ACC/AHA Guideline on Perioperative Cardiovascular Evaluation for Noncardiac Surgery and relevant UpToDate summaries.  He has high DASI and no chest pain/shortness of breath/palpitations/syncope ever.  Continue current cardiac medications as prescribed unless otherwise instructed. Continuous ECG monitoring will be performed intraoperatively and for 30 minutes postoperatively. Postoperative management will include PACU monitoring followed by telemetry on surgical floor if any issues emerge.   Cervical spine precautions due to limited range of motion of neck recommended.  Smoking cessation recommended.   I engaged in a detailed discussion with the patient regarding these risks, benefits, and recommendations, thoroughly explaining the implications of each risk assessment tool (RCRI, NSQIP, and DASI) and how they contribute to the overall risk stratification. I used clarifying questions and summaries (teach-back method) to ensure their complete understanding of the information presented, including their individual risk profile and the rationale for recommended testing and monitoring. The patient demonstrated a clear understanding of these elements and possesses the capacity to make informed decisions regarding their care. We also discussed alternative treatment options, including living with the pain, and the patient's rationale for proceeding with surgery was can't put it off any longer  Despite any recommendations I may provide, the patient was empowered with the ultimate right to choose the course of treatment that best suits him. This documented discussion will be readily available to the patient in their MyChart.   Assessment and Plan Assessment & Plan Primary osteoarthritis of right knee   Severe osteoarthritis in the right knee is causing significant pain and affecting his quality of life. He is considering knee replacement  surgery with Dr. Jerri, a highly rated orthopedist. The risk of serious complications is 1.7%, including pneumonia, blood clots, and surgical site infection. He is informed about the potential need for future replacement of the prosthesis, though this risk is low.  He wishes to proceed with surgery to improve his quality of life. Smoking cessation and tight blood sugar control are recommended to enhance surgical outcomes and reduce infection risk. Preoperative lab work, including kidney function and diabetes panel, will be ordered after July 10, 2024.  Type 2 diabetes mellitus with hyperglycemia   Diabetes is managed with Metformin and Glipizide , with an A1c of 7.1% in August 2025. Tight blood sugar control is crucial to minimize surgical site infection risk. He is advised to use a continuous glucose meter for better preoperative blood sugar management. The Ozempic  dose will be increased to 0.5 mg weekly, and a diabetes panel, including A1c, will be ordered after July 10, 2024.  Tobacco use disorder, current, heavy smoker   He smokes heavily, about one pack per day. Smoking cessation is critical to improve surgical outcomes, reduce infection risk, and enhance recovery. Quitting smoking perioperatively will improve blood flow and recovery. Nicotine patches, lozenges, and Xen are recommended as alternatives to smoking. Nicotine patches and lozenges will be sent, and he is advised to try Xen.  Obesity   Obesity contributes to GERD and may impact surgical outcomes. Weight management is important for overall health and surgical risk reduction. Efforts should continue, including potential use of Ozempic .  Gastroesophageal reflux disease (GERD)   Intermittent heartburn, possibly related to weight and diet, is managed with over-the-counter medications like generic Pepcid. Lifestyle changes, such as weight loss and dietary adjustments, are recommended to help manage heartburn.  Cervical radiculopathy    Cervical radiculopathy symptoms have improved, but neck mobility is limited. Cervical spine precautions are recommended during surgery.  Chronic kidney disease stage 2   Stage 2 chronic kidney disease is well-managed. Kidney function will be monitored with preoperative lab work after July 10, 2024.  Hypertension: Borderline controlled goal 130/80 increase propranolol 20 to 60 mg long acting for better control and surgical safety.       EKG was ordered after visit, nurse will ask to return for preoperative EKG 12 lead   ICD-10-CM   1. Preop examination  Z01.818 EKG 12-Lead    2. Smoker  F17.200 nicotine (NICODERM CQ - DOSED IN MG/24 HOURS) 14 mg/24hr patch    nicotine (NICODERM CQ - DOSED IN MG/24 HR) 7 mg/24hr patch    nicotine polacrilex (NICORETTE) 2 MG gum    nicotine polacrilex (NICORETTE) 2 MG gum    nicotine polacrilex (COMMIT) 2 MG lozenge    nicotine polacrilex (COMMIT) 2 MG lozenge    3. Controlled diabetes mellitus type 2 with complications (HCC)  E11.8 CBC with Differential/Platelet    Comprehensive metabolic panel with GFR    Lipid panel    Microalbumin / creatinine urine ratio    Semaglutide ,0.25 or 0.5MG /DOS, (OZEMPIC , 0.25 OR 0.5 MG/DOSE,) 2 MG/3ML SOPN    DISCONTINUED: Continuous Glucose Sensor MISC    4. Screening for malignant neoplasm of colon  Z12.11 Ambulatory referral to Gastroenterology    5. Primary hypertension  I10 propranolol ER (INDERAL LA) 60 MG 24 hr capsule      Care coordination: A copy of this note will be forwarded to the requesting physician   Cardiac Risk Assessment Protocol:   This preoperative cardiac risk assessment is performed in accordance with the 2022 ACC/AHA Guideline on Perioperative Cardiovascular Evaluation and the expert guidelines from UpToDate (www.uptodate.com).  Key features of these recommendations:  We usually obtain an electrocardiogram (ECG) for patients with known cardiovascular disease, significant arrhythmia,  or significant structural heart disease if they  are to undergo an intermediate- to high-risk surgery. If the patient reports no recent or interim cardiac symptoms, we do not repeat an ECG if one has been done in the past 12 months. We do not routinely perform a preoperative ECG in patients who undergo low-risk surgery (<1 percent estimated risk of cardiac death or nonfatal MI)  Other than a serum creatinine, no routine screening blood tests are indicated specifically for evaluation of cardiac risk.   Elevated NT proBNP is associated with increased 30-day and 1 year death.  We do not routinely use BNP or NT-proBNP for preoperative risk stratification as their use has not yet been associated with improved clinical outcomes. However, BNP/NT-proBNP may be helpful in patients for whom the decision to undergo preoperative cardiac testing is unclear.  We use an NT-proBNP cutoff  of 200 pg/mL or a BNP of 92 ng/L to help decide whether or not to pursue stress testing or coronary computed tomography angiography (CCTA) in patients with unclear cardiac risk in whom testing would influence management  The ACC/AHA guidelines do not recommend obtaining a preoperative troponin level, and most clinicians do not routinely order them. We recognize that the 2022 ESC guidelines recommend obtaining preoperative troponins in patients >43 years old or with cardiac risk factors undergoing intermediate to high-risk surgery and take this into consideration when deciding about recommending them.  Intermediate-Risk Surgery (1-5% MACE) Obtain baseline ECG and consider measurement of natriuretic peptides (BNP/NT-proBNP) in patients with intermediate-risk procedures, particularly if functional capacity is limited; refer to cardiology when clinical risk factors or symptoms are present [facility]. Smoking cessation: Provide counseling and support for cessation at least 4?weeks before surgery to reduce perioperative complications (Class I)  American College of Cardiology. Frailty assessment: In patients >=70?years undergoing intermediate/high-risk surgery, perform a validated frailty assessment to guide perioperative planning (Class IIa) American College of Cardiology.  This patient was ASA II (Mild systemic disease) Optimize mild comorbidities (e.g., hypertension, well-controlled diabetes) preoperatively; no specialized cardiac or anesthesia evaluation necessary beyond standard care NCBI.     Subjective:  HPI       DASI calculated mets The higher the DASI score, the more physically active the patient is. Patients who can achieve <4 METs have poor functional capacity, 4 to 10 METs suggest moderate functional capacity, and >10 METs suggest excellent functional capacity. The DASI questionnaire is not designed to assess very high levels of physical activity. The maximum DASI score is 58.2, which would be the equivalent of 9.89 METs.  Its controversial but many clinicians use a cutoff DASI of less than 25-34 as a marker of increased surgical risk.    Curator of Surgeons Surgical Risk (ACS NSQIP) This calculator is best for calculating surgery specific risk - High risk - >5 percent - Intermediate risk - >=1 to <=5 percent - Low risk - <1 percent       MICA Zane) perioperative risk  RCRI Calculated Risk  RCRI = 0 (Low Risk: <1% MACE) Patients with an RCRI score of 0 do not require additional preoperative cardiac testing or cardiology consultation beyond routine evaluation, and may proceed with standard perioperative monitoring PMC.  Discussed the use of AI scribe software for clinical note transcription with the patient, who gave verbal consent to proceed.  History of Present Illness 63 year old male with diabetes and knee pain who presents for preoperative evaluation for knee surgery.  He experiences significant knee pain that has persisted for many years, impacting his quality of life and  ability to engage in  activities he enjoys, such as golfing. After playing one round of golf, he experiences swelling and requires elevation and ice for the next three days. Currently, he is unable to play golf or walk on the beach without significant discomfort. He has a history of hip issues, initially thought to be related to spinal stenosis, but improved significantly after hip surgery that addressed arthritis. This past experience has influenced his decision to proceed with knee surgery.  He reports occasional heartburn over the past year, occurring once every couple of months, which is relieved by taking generic Pepcid. He attributes this to possible weight and diet factors, noting he has always been heavy.  He smokes approximately one pack of cigarettes per day and acknowledges the need to cut back, especially in preparation for surgery. He has previously quit smoking for a year and is considering using nicotine patches to aid in cessation.  He has diabetes, with a recent A1c of 7.1. He is on multiple medications, including Ozempic  at a dose of 0.25 mg weekly, which he plans to increase to 0.5 mg. He is also on four other medications for his diabetes and blood pressure. He reports a history of stable blood pressure readings at home.  He experiences nocturia, waking up multiple times at night to urinate, but is able to return to sleep without difficulty. He also reports occasional tightness in his shoes after sitting for a while, but no significant swelling in his ankles.  His family history includes his father, who was on dialysis in the last years of his life, starting at age 90.  ROS   gastroesophageal reflux disease responding to Pepcid, and nocturia unless noted  otherwise, a complete review of systems was negative.   Lab Results  Component Value Date   GFR 82.27 04/09/2024   GFR 67.39 08/11/2023   No results found for: EGFR  Past Medical History:  Diagnosis Date   Arthritis    Diabetes (HCC)     Lumbar facet arthropathy 02/11/2020   Migraines    Mixed hyperlipidemia    Obesity (BMI 35.0-39.9 without comorbidity) 04/02/2009   Formatting of this note might be different from the original. Obesity 10/1 IMO update  Formatting of this note might be different from the original. 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) 04/09/2024   Lip laceration 03/22/2024   Smoker 02/12/2024   Nuclear sclerosis of both eyes 02/12/2024   Obstructive sleep apnea syndrome 02/12/2024   Dry skin dermatitis 09/08/2023   Personal history of tobacco use 08/11/2023   Anemia 08/11/2023   Diverticulosis of colon 08/11/2023   Muscle cramp, nocturnal 08/11/2023   Sensorineural hearing loss (SNHL) of both ears 08/11/2023   Primary osteoarthritis of right knee 08/11/2023   Essential hypertension 08/11/2023   Nocturia 06/08/2023   Essential tremor 03/31/2022   Type 2 diabetes mellitus with hyperglycemia, without long-term current use of insulin (HCC) 02/17/2022   Hyperlipidemia 02/17/2022   Vitamin D deficiency 02/17/2022   Degenerative lumbar spinal stenosis 05/22/2020   DDD (degenerative disc disease), lumbar 02/11/2020   Hip impingement syndrome, left 02/07/2020   Neurogenic  claudication 02/07/2020   Osteoarthritis 04/02/2009   Allergic rhinitis 04/02/2009   Obesity, unspecified 04/02/2009   Past Surgical History:  Procedure Laterality Date   KNEE ARTHROSCOPY W/ DEBRIDEMENT Right    PARTIAL HIP ARTHROPLASTY  2021    History reviewed. No pertinent family history.  Medications were reconciled and perioperative medicine management was discussed Current Outpatient  Medications  Medication Sig Dispense Refill   nicotine (NICODERM CQ - DOSED IN MG/24 HOURS) 14 mg/24hr patch RX #2 Weeks 5-6: 14 mg x 1 patch daily. Wear for 24 hours. If you have sleep disturbances, remove at bedtime. 14 patch 0   nicotine (NICODERM CQ - DOSED IN MG/24 HR) 7 mg/24hr patch RX #3 Weeks 7-8: 7 mg x 1 patch daily. Wear for 24 hours. If you have sleep disturbances, remove at bedtime. 14 patch 0   nicotine polacrilex (COMMIT) 2 MG lozenge RX #2 Weeks 7-9: 1 lozenge every 2-4 hours.Max 20 lozenges per day. 252 lozenge 0   nicotine polacrilex (COMMIT) 2 MG lozenge RX #3 Weeks 10-12: 1 lozenge every 4-8 hours.Max 20 lozenges per day. 126 lozenge 0   nicotine polacrilex (NICORETTE) 2 MG gum RX #2 Weeks 7-9: 1 piece every 2-4 hours.Max 24 pieces per day. 252 each 0   nicotine polacrilex (NICORETTE) 2 MG gum RX #3 Weeks 10-12: 1 piece every 4-8 hours.Max 24 pieces per day. 126 each 0   propranolol ER (INDERAL LA) 60 MG 24 hr capsule Take 1 capsule (60 mg total) by mouth daily. Replaces 20 mg dosing 90 capsule 3   Semaglutide ,0.25 or 0.5MG /DOS, (OZEMPIC , 0.25 OR 0.5 MG/DOSE,) 2 MG/3ML SOPN Inject 0.5 mg into the skin once a week. 3 mL 3   aspirin 81 MG chewable tablet Chew 81 mg by mouth daily.     Blood Glucose Monitoring Suppl DEVI 1 each by Does not apply route in the morning, at noon, and at bedtime. May substitute to any manufacturer covered by patient's insurance. 1 each 0   Continuous Glucose Sensor (FREESTYLE LIBRE 3 PLUS SENSOR) MISC USE AS DIRECTED 2 each 2   diclofenac  Sodium (VOLTAREN ) 1 % GEL Apply 4 g topically 4 (four) times daily as needed. 100 g 11   glipiZIDE  (GLUCOTROL ) 10 MG tablet Take 1 tablet (10 mg total) by mouth 2 (two) times daily. 180 tablet 4   lisinopril  (ZESTRIL ) 5 MG tablet Take 1 tablet (5 mg total) by mouth in the morning. 90 tablet 0   metFORMIN (GLUCOPHAGE) 1000 MG tablet TAKE 1 TABLET BY MOUTH TWICE A DAY WITH MORNING AND EVENING MEALS     Naltrexone  HCl,  Pain, 4.5 MG CAPS Take 1 capsule by mouth daily. 90 capsule 0   rosuvastatin  (CRESTOR ) 20 MG tablet Take 1 tablet (20 mg total) by mouth daily. 90 tablet 3   Semaglutide ,0.25 or 0.5MG /DOS, (OZEMPIC , 0.25 OR 0.5 MG/DOSE,) 2 MG/3ML SOPN Inject 0.5 mg into the skin once a week. Stop metformin when this is started after 0.25 mg x 4 weeks has been completed. 3 mL 5   triamcinolone  cream (KENALOG ) 0.5 % Apply 1 Application topically 2 (two) times daily. To affected areas, do not use >14 consecutive days (Patient not taking: Reported on 05/31/2024) 30 g 3   No current facility-administered medications for this visit.    Allergies-reviewed and updated No Known Allergies  Social History   Socioeconomic History   Marital status: Divorced    Spouse name: Not on file   Number of children: 2  Years of education: Not on file   Highest education level: Some college, no degree  Occupational History   Not on file  Tobacco Use   Smoking status: Every Day    Types: Cigarettes   Smokeless tobacco: Never  Vaping Use   Vaping status: Never Used  Substance and Sexual Activity   Alcohol use: Yes    Alcohol/week: 4.0 standard drinks of alcohol    Types: 4 Cans of beer per week   Drug use: Never   Sexual activity: Not on file  Other Topics Concern   Not on file  Social History Narrative   One grandchild    Social Drivers of Health   Financial Resource Strain: Medium Risk (06/10/2024)   Overall Financial Resource Strain (CARDIA)    Difficulty of Paying Living Expenses: Somewhat hard  Food Insecurity: No Food Insecurity (06/10/2024)   Hunger Vital Sign    Worried About Running Out of Food in the Last Year: Never true    Ran Out of Food in the Last Year: Never true  Recent Concern: Food Insecurity - Food Insecurity Present (04/05/2024)   Hunger Vital Sign    Worried About Running Out of Food in the Last Year: Sometimes true    Ran Out of Food in the Last Year: Sometimes true  Transportation  Needs: No Transportation Needs (06/10/2024)   PRAPARE - Administrator, Civil Service (Medical): No    Lack of Transportation (Non-Medical): No  Physical Activity: Insufficiently Active (06/10/2024)   Exercise Vital Sign    Days of Exercise per Week: 3 days    Minutes of Exercise per Session: 30 min  Stress: No Stress Concern Present (06/10/2024)   [employer] of Occupational Health - Occupational Stress Questionnaire    Feeling of Stress: Only a little  Social Connections: Moderately Isolated (06/10/2024)   Social Connection and Isolation Panel    Frequency of Communication with Friends and Family: Three times a week    Frequency of Social Gatherings with Friends and Family: Once a week    Attends Religious Services: More than 4 times per year    Active Member of [employer] or Organizations: No    Attends Engineer, structural: Not on file    Marital Status: Divorced         Objective:  Physical Exam:  I. General Appearance:      General: Well-developed, well-nourished, in no acute distress.      Level of Consciousness: Alert and oriented to person, place, and time.     BP 136/82   Pulse 86   Temp 98 F (36.7 C) (Temporal)   Ht 6' 1 (1.854 m)   Wt 250 lb 12.8 oz (113.8 kg)   SpO2 98%   BMI 33.09 kg/m        Wt Readings from Last 3 Encounters:  06/13/24 250 lb 12.8 oz (113.8 kg)  04/30/24 243 lb (110.2 kg)  04/09/24 243 lb 9.6 oz (110.5 kg)        BP Readings from Last 3 Encounters:  06/13/24 136/82  05/31/24 138/73  04/30/24 126/72    Clinical Frailty Scale: Managing Well: People whose medical problems are well-controlled but are not regularly active beyond routine walking.  II. Head, Eyes, Ears, Nose, and Throat (HEENT):      Head: Normocephalic, atraumatic (NCAT).     Eyes:         Pupils: Equal, round, reactive to light and accommodation (PERRLA).  Sclerae: Non-icteric.         Conjunctivae: Pink, no injection.          Extraocular Movements (EOMs): Intact.     II (hard and soft palate, upper portion of tonsils anduvula visible)     Any conditions that might make intubation difficult (e.g., limited neck mobility, prominent overbite): limited range of motion neck turning to left.      Ears: External canals clear, tympanic membranes normal. (If examined)     Nose: Nasal mucosa moist, no discharge.     Throat: Oropharynx moist, no erythema or exudates.  III. Cardiovascular (CV):      Heart Rate and Rhythm: Regular rate and rhythm.  Pulse Readings from Last 1 Encounters:  06/13/24 86       Heart Sounds: No murmurs, rubs, or gallops. Specify S1 and S2 present.     Peripheral Pulses: Palpable and equal bilaterally (document location, e.g., radial, dorsalis pedis). If pulses are diminished or absent, document that as well.     Capillary Refill: <2 seconds.  IV. Respiratory:      Lungs: Clear to auscultation bilaterally. No wheezes, rales (crackles), or rhonchi.   SpO2 Readings from Last 1 Encounters:  06/13/24 98%       Respiratory Effort:  No use of accessory muscles, no retractions .Normal chest wall movement .  V. Abdomen:      Abdomen: Soft, non-tender, non-distended.     Bowel Sounds: Present in all four quadrants.     No guarding, rigidity, or rebound tenderness. (If present, specify location and severity.)  VI. Musculoskeletal:      Extremities: No edema, cyanosis, or clubbing.     Range of Motion:  limited range of motion cervical spine      Skin: Warm, dry, intact. No rashes, lesions, or ulcers of concern near planned surgical site.  There is swelling    VII. Neurologic (Neuro):      Mental Status: Alert and oriented      Cranial Nerves: Grossly intact.     Motor:  No focal or generalized weakness apparent     Gait: (If assessed) Steady gait.  VIII. Psychiatric (Psych):      Mood and Affect: Appropriate.     Thought Process and Content: Normal. Demonstrates understanding.        On the day of the visit, I dedicated 42 minutes to both direct and indirect patient care activities.  The time was spent: Preparation: I reviewed the patient's records before and during the visit to support individualized clinical decision making. History: I obtained, documented, and reviewed a thorough medical history. I reviewed the patient's reported symptoms and clarified their context and significance in relation to the current visit. Examination: I conducted a medically appropriate physical evaluation. Data Synthesis: I synthesized information for clinical decision-making. Communication: I communicated clinical status and plan to the patient and/or family/caregiver. Counseling & Education: I provided personalized counseling on condition and treatment. Documentation: Documenting clinical findings and medical decision-making, and creating and providing documentation for patient review. Treatment Plan: I worked collaboratively with the patient to formulate and communicate an individualized plan (including shared decision-making). Orders: I placed necessary orders (medications, labs, imaging, referrals) in the EMR. Referrals and Communication: I referred the patient to other health care professionals as needed and communicated with them to ensure coordinated care.  This time was spent independently of any separately billable procedures. Please note that this statement is intended to provide a clear and comprehensive account of  the time and services provided during the patient's visit.  The extended time spent was necessary to provide safe, effective, and comprehensive care due to the following factors:, Extensive Comorbidities: The patient's multiple chronic conditions necessitated careful coordination, monitoring, and integration of care plans., Data Analysis & Complex Decision-Making: I performed in-depth data review and complex treatment planning tailored to the patient's unique clinical  profile., and Complex Preoperative Evaluation: A thorough preoperative assessment was required to optimize surgical outcomes and minimize perioperative risk.

## 2024-06-13 NOTE — Patient Instructions (Signed)
 It was a pleasure seeing you today! Your health and satisfaction are our top priorities.  Bernardino Cone, MD  VISIT SUMMARY: You came in today for a preoperative evaluation for your upcoming knee surgery. We discussed your knee pain, diabetes management, smoking cessation, weight management, and occasional heartburn. We also reviewed your history of cervical radiculopathy and chronic kidney disease.  YOUR PLAN: -PRIMARY OSTEOARTHRITIS OF RIGHT KNEE: You have severe osteoarthritis in your right knee, which is causing significant pain and affecting your quality of life. You are considering knee replacement surgery with Dr. Jerri. The risk of serious complications is 1.7%, including pneumonia, blood clots, and surgical site infection. Smoking cessation and tight blood sugar control are recommended to enhance surgical outcomes and reduce infection risk. Preoperative lab work, including kidney function and diabetes panel, will be ordered after July 10, 2024.  -TYPE 2 DIABETES MELLITUS WITH HYPERGLYCEMIA: Your diabetes is managed with Metformin and Glipizide , with an A1c of 7.1% in August 2025. Tight blood sugar control is crucial to minimize surgical site infection risk. You are advised to use a continuous glucose meter for better preoperative blood sugar management. The Ozempic  dose will be increased to 0.5 mg weekly, and a diabetes panel, including A1c, will be ordered after July 10, 2024.  -TOBACCO USE DISORDER, CURRENT, HEAVY SMOKER: You smoke heavily, about one pack per day. Smoking cessation is critical to improve surgical outcomes, reduce infection risk, and enhance recovery. Quitting smoking perioperatively will improve blood flow and recovery. Nicotine patches, lozenges, and Xen are recommended as alternatives to smoking. Nicotine patches and lozenges will be sent, and you are advised to try Xen.  -OBESITY: Your weight contributes to GERD and may impact surgical outcomes. Weight management is  important for overall health and surgical risk reduction. Efforts should continue, including potential use of Ozempic .  -GASTROESOPHAGEAL REFLUX DISEASE (GERD): You experience intermittent heartburn, possibly related to weight and diet, which is managed with over-the-counter medications like generic Pepcid. Lifestyle changes, such as weight loss and dietary adjustments, are recommended to help manage heartburn.  -CERVICAL RADICULOPATHY: Your cervical radiculopathy symptoms have improved, but neck mobility is limited. Cervical spine precautions are recommended during surgery.  -CHRONIC KIDNEY DISEASE STAGE 2: Your stage 2 chronic kidney disease is well-managed. Kidney function will be monitored with preoperative lab work after July 10, 2024.  INSTRUCTIONS: Please follow up with preoperative lab work, including kidney function and diabetes panel, after July 10, 2024. Continue to manage your blood sugar levels closely and work on smoking cessation using the provided nicotine patches and lozenges. Consider using a continuous glucose meter for better blood sugar management. Maintain your weight management efforts and make dietary adjus tments to help manage your GERD. Follow cervical spine precautions during surgery.  Your Providers PCP: Cone [name], MD,  [phone]) Referring Provider: Cone [name], MD,  [phone])  NEXT STEPS: [x]  Early Intervention: Schedule sooner appointment, call our on-call services, or go to emergency room if there is any significant Increase in pain or discomfort New or worsening symptoms Sudden or severe changes in your health [x]  Flexible Follow-Up: We recommend a No follow-ups on file. for optimal routine care. This allows for progress monitoring and treatment adjustments. [x]  Preventive Care: Schedule your annual preventive care visit! It's typically covered by insurance and helps identify potential health issues early. [x]  Lab & X-ray  Appointments: Incomplete tests scheduled today, or call to schedule. X-rays: [facility] (M-F, 8:30am-noon or 1pm-5pm). [x]  Medical Information Release: Sign  a release form at front desk to obtain relevant medical information we don't have.  MAKING THE MOST OF OUR FOCUSED 20 MINUTE APPOINTMENTS: [x]   Clearly state your top concerns at the beginning of the visit to focus our discussion [x]   If you anticipate you will need more time, please inform the front desk during scheduling - we can book multiple appointments in the same week. [x]   If you have transportation problems- use our convenient video appointments or ask about transportation support. [x]   We can get down to business faster if you use MyChart to update information before the visit and submit non-urgent questions before your visit. Thank you for taking the time to provide details through MyChart.  Let our nurse know and she can import this information into your encounter documents.  Arrival and Wait Times: [x]   Arriving on time ensures that everyone receives prompt attention. [x]   Early morning (8a) and afternoon (1p) appointments tend to have shortest wait times. [x]   Unfortunately, we cannot delay appointments for late arrivals or hold slots during phone calls.  Getting Answers and Following Up [x]   Simple Questions & Concerns: For quick questions or basic follow-up after your visit, reach us  at [phone] or MyChart messaging. [x]   Complex Concerns: If your concern is more complex, scheduling an appointment might be best. Discuss this with the staff to find the most suitable option. [x]   Lab & Imaging Results: We'll contact you directly if results are abnormal or you don't use MyChart. Most normal results will be on MyChart within 2-3 business days, with a review message from Dr. [name]. Haven't heard back in 2 weeks? Need results sooner? Contact us  at [phone]. [x]   Referrals: Our referral coordinator will  manage specialist referrals. The specialist's office should contact you within 2 weeks to schedule an appointment. Call us  if you haven't heard from them after 2 weeks.  Staying Connected [x]   MyChart: Activate your MyChart for the fastest way to access results and message us . See the last page of this paperwork for instructions on how to activate.  Bring to Your Next Appointment [x]   Medications: Please bring all your medication bottles to your next appointment to ensure we have an accurate record of your prescriptions. [x]   Health Diaries: If you're monitoring any health conditions at home, keeping a diary of your readings can be very helpful for discussions at your next appointment.  Billing [x]   X-ray & Lab Orders: These are billed by separate companies. Contact the invoicing company directly for questions or concerns. [x]   Visit Charges: Discuss any billing inquiries with our administrative services team.  Your Satisfaction Matters [x]   Share Your Experience: We strive for your satisfaction! If you have any complaints, or preferably compliments, please let Dr. [name] know directly or contact our Practice Administrators, Manuelita Rubin or [employer], by asking at the front desk.   Reviewing Your Records [x]   Review this early draft of your clinical encounter notes below and the final encounter summary tomorrow on MyChart after its been completed.  All orders placed so far are visible here: Preop examination -     EKG 12-Lead  Smoker  Controlled diabetes mellitus type 2 with complications (HCC) -     CBC with Differential/Platelet; Future -     Comprehensive metabolic panel with GFR; Future -     Lipid panel; Future -     Microalbumin / creatinine urine ratio; Future -     Ozempic  (0.25 or  0.5 MG/DOSE); Inject 0.5 mg into the skin once a week.  Dispense: 3 mL; Refill: 3  Screening for malignant neoplasm of colon -     Ambulatory referral to Gastroenterology

## 2024-06-14 ENCOUNTER — Telehealth: Payer: Self-pay

## 2024-06-14 ENCOUNTER — Other Ambulatory Visit (HOSPITAL_COMMUNITY): Payer: Self-pay

## 2024-06-14 DIAGNOSIS — R809 Proteinuria, unspecified: Secondary | ICD-10-CM

## 2024-06-14 MED ORDER — DEXCOM G7 SENSOR MISC: 11 refills | Status: DC

## 2024-06-14 NOTE — Telephone Encounter (Signed)
 Pharmacy Patient Advocate Encounter   Received notification from Onbase that prior authorization for FreeStyle Libre 3 Plus Sensor is required/requested.   Insurance verification completed.   The patient is insured through [facility].   Per test claim:  OTHELIA is preferred by the insurance.  If suggested medication is appropriate, Please send in a new RX and discontinue this one. If not, please advise as to why it's not appropriate so that we may request a Prior Authorization. Please note, some preferred medications may still require a PA.  If the suggested medications have not been trialed and there are no contraindications to their use, the PA will not be submitted, as it will not be approved.   DEXCOM is preferred, however insurance requires patient to be insulin user to approve. See rejection below.

## 2024-06-14 NOTE — Telephone Encounter (Signed)
 Returned patients called. Verified I faxed the last office visit notes to his case worker.

## 2024-06-14 NOTE — Telephone Encounter (Signed)
 Spoke with pt states he would not have surgery til January next year he will get EKG next visit in nov.

## 2024-06-18 ENCOUNTER — Telehealth: Payer: Self-pay

## 2024-06-18 NOTE — Telephone Encounter (Signed)
 Pharmacy Patient Advocate Encounter   Received notification from Onbase that prior authorization for Dexcom G7 Sensor is required/requested.   Insurance verification completed.   The patient is insured through [facility] MEDICAID.   Per test claim: PA required; PA submitted to above mentioned insurance via Latent Key/confirmation #/[facility] Medicaid 2017 Status is pending

## 2024-06-18 NOTE — Telephone Encounter (Signed)
 Review and advise if want to appeal or anything

## 2024-06-18 NOTE — Telephone Encounter (Signed)
 Pharmacy Patient Advocate Encounter  Received notification from [facility] MEDICAID that Prior Authorization for Dexcom G7 Sensor has been DENIED.  Full denial letter will be uploaded to the media tab. See denial reason below.   PA #/Case ID/Reference #: [id number]

## 2024-06-24 ENCOUNTER — Encounter: Payer: Self-pay | Admitting: Radiology

## 2024-07-11 ENCOUNTER — Ambulatory Visit: Admitting: Internal Medicine

## 2024-07-11 ENCOUNTER — Encounter: Payer: Self-pay | Admitting: Internal Medicine

## 2024-07-11 ENCOUNTER — Ambulatory Visit: Payer: Self-pay | Admitting: Internal Medicine

## 2024-07-11 VITALS — BP 130/74 | HR 75 | Temp 98.0°F | Ht 73.0 in | Wt 245.0 lb

## 2024-07-11 DIAGNOSIS — R0602 Shortness of breath: Secondary | ICD-10-CM

## 2024-07-11 DIAGNOSIS — M1710 Unilateral primary osteoarthritis, unspecified knee: Secondary | ICD-10-CM

## 2024-07-11 DIAGNOSIS — I4510 Unspecified right bundle-branch block: Secondary | ICD-10-CM

## 2024-07-11 DIAGNOSIS — Z01818 Encounter for other preprocedural examination: Secondary | ICD-10-CM | POA: Diagnosis not present

## 2024-07-11 DIAGNOSIS — I10 Essential (primary) hypertension: Secondary | ICD-10-CM | POA: Diagnosis not present

## 2024-07-11 DIAGNOSIS — E118 Type 2 diabetes mellitus with unspecified complications: Secondary | ICD-10-CM

## 2024-07-11 DIAGNOSIS — R609 Edema, unspecified: Secondary | ICD-10-CM

## 2024-07-11 DIAGNOSIS — Z1211 Encounter for screening for malignant neoplasm of colon: Secondary | ICD-10-CM

## 2024-07-11 DIAGNOSIS — E1165 Type 2 diabetes mellitus with hyperglycemia: Secondary | ICD-10-CM

## 2024-07-11 DIAGNOSIS — R809 Proteinuria, unspecified: Secondary | ICD-10-CM | POA: Insufficient documentation

## 2024-07-11 LAB — CBC WITH DIFFERENTIAL/PLATELET
Basophils Absolute: 0 K/uL (ref 0.0–0.1)
Basophils Relative: 0.6 % (ref 0.0–3.0)
Eosinophils Absolute: 0.2 K/uL (ref 0.0–0.7)
Eosinophils Relative: 2 % (ref 0.0–5.0)
HCT: 41.8 % (ref 39.0–52.0)
Hemoglobin: 13.7 g/dL (ref 13.0–17.0)
Lymphocytes Relative: 29.1 % (ref 12.0–46.0)
Lymphs Abs: 2.3 K/uL (ref 0.7–4.0)
MCHC: 32.8 g/dL (ref 30.0–36.0)
MCV: 81.4 fl (ref 78.0–100.0)
Monocytes Absolute: 0.5 K/uL (ref 0.1–1.0)
Monocytes Relative: 6.8 % (ref 3.0–12.0)
Neutro Abs: 4.8 K/uL (ref 1.4–7.7)
Neutrophils Relative %: 61.5 % (ref 43.0–77.0)
Platelets: 245 K/uL (ref 150.0–400.0)
RBC: 5.14 Mil/uL (ref 4.22–5.81)
RDW: 16 % — ABNORMAL HIGH (ref 11.5–15.5)
WBC: 7.8 K/uL (ref 4.0–10.5)

## 2024-07-11 LAB — LIPID PANEL
Cholesterol: 109 mg/dL (ref 0–200)
HDL: 41 mg/dL (ref >39.00)
LDL Cholesterol: 54 mg/dL (ref 0–99)
NonHDL: 67.86
Total CHOL/HDL Ratio: 3
Triglycerides: 70 mg/dL (ref 0.0–149.0)
VLDL: 14 mg/dL (ref 0.0–40.0)

## 2024-07-11 LAB — COMPREHENSIVE METABOLIC PANEL WITH GFR
ALT: 19 U/L (ref 0–53)
AST: 18 U/L (ref 0–37)
Albumin: 4.5 g/dL (ref 3.5–5.2)
Alkaline Phosphatase: 52 U/L (ref 39–117)
BUN: 14 mg/dL (ref 6–23)
CO2: 27 meq/L (ref 19–32)
Calcium: 9.5 mg/dL (ref 8.4–10.5)
Chloride: 106 meq/L (ref 96–112)
Creatinine, Ser: 1.06 mg/dL (ref 0.40–1.50)
GFR: 73.83 mL/min (ref >60.00)
Glucose, Bld: 158 mg/dL — ABNORMAL HIGH (ref 70–99)
Potassium: 4.7 meq/L (ref 3.5–5.1)
Sodium: 141 meq/L (ref 135–145)
Total Bilirubin: 0.4 mg/dL (ref 0.2–1.2)
Total Protein: 7.4 g/dL (ref 6.0–8.3)

## 2024-07-11 LAB — MICROALBUMIN / CREATININE URINE RATIO
Creatinine,U: 158.2 mg/dL
Microalb Creat Ratio: 36.9 mg/g — ABNORMAL HIGH (ref 0.0–30.0)
Microalb, Ur: 5.8 mg/dL — ABNORMAL HIGH (ref 0.0–1.9)

## 2024-07-11 LAB — BRAIN NATRIURETIC PEPTIDE: Pro B Natriuretic peptide (BNP): 44 pg/mL (ref 0.0–100.0)

## 2024-07-11 MED ORDER — LISINOPRIL 5 MG PO TABS: 5.0000 mg | ORAL_TABLET | Freq: Every morning | ORAL | 0 refills | Status: AC

## 2024-07-11 NOTE — Progress Notes (Signed)
 Send in Jardiance  10 mg if he is agreeable 90 days

## 2024-07-11 NOTE — Progress Notes (Signed)
 Patient Care Team: Charles Charles MATSU, MD as PCP - General (Internal Medicine) Patient:  Charles Payne Today's Healthcare Provider: Bernardino Payne [name], MD   Chief Complaint:  Requests consultative surgical examination, consent, and appropriate evaluation at the request of Dr.Xu.  Assessment/Plan:   Today's consultation included a comprehensive preoperative evaluation to assess this patients overall health and determine the appropriateness of proceeding with the planned surgery, Right Total Knee Replacement, given their current medical status. I emphasized that while we take all necessary precautions to minimize risks, no surgery is entirely without risk, and this evaluation helps us  identify and mitigate potential risks to the greatest extent possible. Together, we explored all available options to ensure the approach aligned with the patient's values and preferences. I used open communication techniques to explain the potential risks and benefits, reviewing the risk calculations detailed in the HPI with the patient. All specific procedural risks listed in the ACS NSQIP risk calculator, along with patient-specific risks, were explicitly explained and considered. The patient was also made aware that unforeseen and unpredictable risks can occur.  To comprehensively assess the patient's perioperative risk, we utilized several validated risk stratification tools:  Based on these assessments, the planned procedure is an elective intermediate-risk (major orthopedic) noncardiac surgery. RCRI 0; ACS NSQIP estimates cardiac complication 0.2%, death 0.1% ? MACE <1% (low cardiac risk by ACC/AHA). No additional cardiac testing indicated unless new symptoms arise."  Therefore, the following recommendations are made:  Surgery can proceed with close monitoring. No additional preoperative cardiac testing is recommended at this time based on the patient's low predicted perioperative cardiac risk and current  guidelines (2022 ACC/AHA Guideline on Perioperative Cardiovascular Evaluation for Noncardiac Surgery and evidence-based summaries such as UpToDate).  Recommended intraoperative and postoperative monitoring: standard monitoring with continuous ECG given the new incomplete RBBB on preoperative ECG. Because of the history of deep vein thrombosis and new lower-extremity swelling, obtain BNP today, use perioperative venous thromboembolism prophylaxis per protocol, and maintain heightened vigilance for VTE. The patient should continue propranolol  throughout the perioperative period and continue his successful smoking cessation.  I engaged in a detailed discussion with the patient regarding these risks, benefits, and recommendations, thoroughly explaining the implications of each risk assessment tool (RCRI 0, NSQIP, and DASI 507 points 8.97 ME-Ts) and how they contribute to the overall risk stratification. I used clarifying questions and summaries (teach-back method) to ensure their complete understanding of the information presented, including their individual risk profile and the rationale for recommended testing and monitoring. The patient demonstrated a clear understanding of these elements and possesses the capacity to make informed decisions regarding their care.   Despite any recommendations I may provide, the patient was empowered with the ultimate right to choose the course of treatment that best suits him. This documented discussion will be readily available to the patient in their MyChart.   Assessment and Plan Assessment & Plan Preoperative evaluation for right total knee replacement due to unilateral primary osteoarthritis, right knee   He is undergoing preoperative evaluation for a right total knee replacement due to unilateral primary osteoarthritis. The right knee exhibits swelling and discomfort, worsened by physical activity. The Duke Activity Status Index score indicates no need for cardiac  preoperative evaluation. An EKG shows an incomplete right bundle branch block, which is not concerning. The primary concern is the risk of surgical site infection, though the overall risk of serious complications is low. The benefits of surgery are expected to outweigh the risks, with anticipated improvement  in quality of life. Potential complications from infection, such as prosthesis removal and possible amputation, were discussed, though these are rare. Emphasis was placed on smoking cessation and diabetes control to reduce infection risk. The [employer] of Surgeons risk calculator assessment is complete. Basic blood work has been ordered to check kidney function and NT BNP for leg swelling. A surgical clearance letter was provided to Dr. Roselie. He should continue current medications, including propranolol  for tremor, and is encouraged to cease smoking to improve surgical outcomes.  Type 2 diabetes mellitus with hyperglycemia   Type 2 diabetes mellitus is managed with metformin, glipizide , and semaglutide  (Ozempic ). Blood glucose levels are being monitored. Diabetes control is crucial for reducing surgical risks, particularly infection. He should continue current diabetes medications and monitor blood glucose levels regularly.  Essential hypertension   Hypertension is managed with lisinopril . Blood pressure is borderline but acceptable for surgery. He should continue lisinopril  for blood pressure management.  Obesity   Obesity contributes to overall health risks, including potential lower extremity edema. Weight management is ongoing with semaglutide  (Ozempic ). He should continue semaglutide  for weight management and is encouraged to lose weight to reduce surgical risks.  Edema of lower extremities   Intermittent edema of the lower extremities may be related to liver disease and obesity. No significant leg swelling was observed during examination. NT BNP has been ordered to assess for cardiac  causes of edema. Weight management efforts should continue.  Shortness of breath with exertion   Shortness of breath with exertion is reported but not significant enough to warrant further pulmonary evaluation. Smoking cessation is expected to improve respiratory function. He is encouraged to cease smoking to improve respiratory function.  History of tobacco use, currently abstinent   He is currently abstinent from tobacco use, which is expected to improve surgical outcomes and overall health. Smoking cessation efforts should continue.  Cervical radiculopathy and neck pain   Cervical radiculopathy with neck pain has improved with the use of a memory foam pillow. Full range of motion is not yet restored. He should continue using the memory foam pillow for neck support.  Essential tremor   Essential tremor is managed with propranolol , which also aids in blood pressure control. The tremor is well-controlled with the current medication regimen. He should continue propranolol  for tremor management.  General Health Maintenance   A colonoscopy referral for cancer screening is pending, and a dermatology appointment is scheduled for skin lesion evaluation. A third referral for colonoscopy has been submitted. He should attend the dermatology appointment for skin lesion evaluation.       Subjective:  HPI  Discussed the use of AI scribe software for clinical note transcription with the patient, who gave verbal consent to proceed.  History of Present Illness 63 year old male who presents for preoperative evaluation for a total knee replacement.  He is scheduled for a total knee replacement due to significant swelling and discomfort in his right knee, which worsens with physical activity such as playing golf. After playing nine holes of golf, his knee becomes very uncomfortable, making it difficult to walk for a few days. He has been a candidate for knee replacement since 2000 after his last  arthroscopic surgery.  He has a history of hip replacement, which was successful and improved his quality of life significantly. He hopes for a similar outcome with the knee replacement.  He has a history of pleurisy in 1990, initially misdiagnosed as a heart attack. He has never had  any other heart-related issues and has not had any problems on an EKG since then.  He has a history of smoking but quit a couple of weeks ago. He is currently using nicotine  replacement therapy but experiences heartburn as a side effect. He is motivated to remain smoke-free until at least January.  He is currently on Ozempic  0.5 mg for weight management. He is also on metformin and glipizide  for diabetes management, and lisinopril  for hypertension. He takes propranolol  for essential tremors, which he finds effective in managing his symptoms.  He reports occasional swelling in his feet after sitting for a while, which he attributes to his weight.  He has a history of a blood clot approximately ten years ago and is aware of the increased risk of clotting with surgery.  He reports a stiff neck that has improved over time, and he uses a memory foam pillow which has helped alleviate his symptoms. He reports that his neck is sore at times and that his range of motion has improved over time.     DASI calculated mets The higher the DASI score, the more physically active the patient is. Patients who can achieve <4 METs have poor functional capacity, 4 to 10 METs suggest moderate functional capacity, and >10 METs suggest excellent functional capacity. The DASI questionnaire is not designed to assess very high levels of physical activity. The maximum DASI score is 58.2, which would be the equivalent of 9.89 METs.  Its controversial but many clinicians use a cutoff DASI of less than 25-34 as a marker of increased surgical risk.      Curator of Surgeons Surgical Risk (ACS NSQIP) This calculator is best for  calculating surgery specific risk - High risk - >5 percent - Intermediate risk - >=1 to <=5 percent - Low risk - <1 percent  : this  patient qualifies as low risk.  RCRI Calculated Risk  ROS   unless noted otherwise, a complete review of systems was negative.    Past Medical History:  Diagnosis Date   Arthritis    Diabetes (HCC)    Lumbar facet arthropathy 02/11/2020   Migraines    Mixed hyperlipidemia    Obesity (BMI 35.0-39.9 without comorbidity) 04/02/2009   Formatting of this note might be different from the original. Obesity 10/1 IMO update  Formatting of this note might be different from the original. 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) 04/09/2024   Lip laceration 03/22/2024   Smoker 02/12/2024   Nuclear sclerosis of both eyes 02/12/2024   Obstructive sleep apnea syndrome 02/12/2024   Dry skin dermatitis 09/08/2023   Personal history of tobacco use 08/11/2023   Anemia 08/11/2023   Diverticulosis of colon 08/11/2023   Muscle cramp, nocturnal 08/11/2023   Sensorineural hearing loss (SNHL) of both ears 08/11/2023   Primary osteoarthritis of right knee 08/11/2023   Essential hypertension 08/11/2023   Nocturia 06/08/2023   Essential tremor 03/31/2022   Type 2 diabetes mellitus with hyperglycemia, without long-term  current use of insulin (HCC) 02/17/2022   Hyperlipidemia 02/17/2022   Vitamin D deficiency 02/17/2022   Degenerative lumbar spinal stenosis 05/22/2020   DDD  (degenerative disc disease), lumbar 02/11/2020   Hip impingement syndrome, left 02/07/2020   Neurogenic claudication 02/07/2020   Osteoarthritis 04/02/2009   Allergic rhinitis 04/02/2009   Obesity, unspecified 04/02/2009   Past Surgical History:  Procedure Laterality Date   KNEE ARTHROSCOPY W/ DEBRIDEMENT Right    PARTIAL HIP ARTHROPLASTY  2021    History reviewed. No pertinent family history.  Medications were reconciled and perioperative medicine management was discussed Current Outpatient Medications  Medication Sig Dispense Refill   aspirin 81 MG chewable tablet Chew 81 mg by mouth daily.     diclofenac  Sodium (VOLTAREN ) 1 % GEL Apply 4 g topically 4 (four) times daily as needed. 100 g 11   empagliflozin  (JARDIANCE ) 10 MG TABS tablet Take 1 tablet (10 mg total) by mouth daily. 90 tablet 1   glipiZIDE  (GLUCOTROL ) 10 MG tablet Take 1 tablet (10 mg total) by mouth 2 (two) times daily. 180 tablet 4   metFORMIN (GLUCOPHAGE) 1000 MG tablet TAKE 1 TABLET BY MOUTH TWICE A DAY WITH MORNING AND EVENING MEALS (Patient taking differently: Takes 500mg  twice a day)     nicotine  polacrilex (COMMIT) 2 MG lozenge RX #2 Weeks 7-9: 1 lozenge every 2-4 hours.Max 20 lozenges per day. 252 lozenge 0   propranolol  ER (INDERAL  LA) 60 MG 24 hr capsule Take 1 capsule (60 mg total) by mouth daily. Replaces 20 mg dosing 90 capsule 3   rosuvastatin  (CRESTOR ) 20 MG tablet Take 1 tablet (20 mg total) by mouth daily. 90 tablet 3   Semaglutide ,0.25 or 0.5MG /DOS, (OZEMPIC , 0.25 OR 0.5 MG/DOSE,) 2 MG/3ML SOPN Inject 0.5 mg into the skin once a week. Stop metformin when this is started after 0.25 mg x 4 weeks has been completed. 3 mL 5   Blood Glucose Monitoring Suppl DEVI 1 each by Does not apply route in the morning, at noon, and at bedtime. May substitute to any manufacturer covered by patient's insurance. 1 each 0   Continuous Glucose Sensor (DEXCOM G7 SENSOR) MISC Apply sensor every 10 days. Pharmacy please provide  3 boxes (1 sensor per week) (Patient not taking: Reported on 07/11/2024) 3 each 11   lisinopril  (ZESTRIL ) 5 MG tablet Take 1 tablet (5 mg total) by mouth in the morning. 90 tablet 0   Naltrexone  HCl, Pain, 4.5 MG CAPS Take 1 capsule by mouth daily. (Patient not taking: Reported on 07/11/2024) 90 capsule 0   nicotine  (NICODERM CQ  - DOSED IN MG/24 HOURS) 14 mg/24hr patch RX #2 Weeks 5-6: 14 mg x 1 patch daily. Wear for 24 hours. If you have sleep disturbances, remove at bedtime. 14 patch 0   nicotine  (NICODERM CQ  - DOSED IN MG/24 HR) 7 mg/24hr patch RX #3 Weeks 7-8: 7 mg x 1 patch daily. Wear for 24 hours. If you have sleep disturbances, remove at bedtime. (Patient not taking: Reported on 07/11/2024) 14 patch 0   nicotine  polacrilex (COMMIT) 2 MG lozenge RX #3 Weeks 10-12: 1 lozenge every 4-8 hours.Max 20 lozenges per day. 126 lozenge 0   nicotine  polacrilex (NICORETTE ) 2 MG gum RX #2 Weeks 7-9: 1 piece every 2-4 hours.Max 24 pieces per day. 252 each 0   nicotine  polacrilex (NICORETTE ) 2 MG gum RX #3 Weeks 10-12: 1 piece every 4-8 hours.Max 24 pieces per day. 126 each 0   Semaglutide ,0.25 or 0.5MG /DOS, (OZEMPIC , 0.25 OR  0.5 MG/DOSE,) 2 MG/3ML SOPN Inject 0.5 mg into the skin once a week. 3 mL 3   triamcinolone  cream (KENALOG ) 0.5 % Apply 1 Application topically 2 (two) times daily. To affected areas, do not use >14 consecutive days (Patient not taking: Reported on 05/31/2024) 30 g 3   No current facility-administered medications for this visit.    Allergies-reviewed and updated No Known Allergies  Social History   Socioeconomic History   Marital status: Divorced    Spouse name: Not on file   Number of children: 2   Years of education: Not on file   Highest education level: Some college, no degree  Occupational History   Not on file  Tobacco Use   Smoking status: Every Day    Types: Cigarettes   Smokeless tobacco: Never  Vaping Use   Vaping status: Never Used  Substance and Sexual  Activity   Alcohol use: Yes    Alcohol/week: 4.0 standard drinks of alcohol    Types: 4 Cans of beer per week   Drug use: Never   Sexual activity: Not on file  Other Topics Concern   Not on file  Social History Narrative   One grandchild    Social Drivers of Health   Financial Resource Strain: Medium Risk (06/10/2024)   Overall Financial Resource Strain (CARDIA)    Difficulty of Paying Living Expenses: Somewhat hard  Food Insecurity: No Food Insecurity (06/10/2024)   Hunger Vital Sign    Worried About Running Out of Food in the Last Year: Never true    Ran Out of Food in the Last Year: Never true  Recent Concern: Food Insecurity - Food Insecurity Present (04/05/2024)   Hunger Vital Sign    Worried About Running Out of Food in the Last Year: Sometimes true    Ran Out of Food in the Last Year: Sometimes true  Transportation Needs: No Transportation Needs (06/10/2024)   PRAPARE - Administrator, Civil Service (Medical): No    Lack of Transportation (Non-Medical): No  Physical Activity: Insufficiently Active (06/10/2024)   Exercise Vital Sign    Days of Exercise per Week: 3 days    Minutes of Exercise per Session: 30 min  Stress: No Stress Concern Present (06/10/2024)   [employer] of Occupational Health - Occupational Stress Questionnaire    Feeling of Stress: Only a little  Social Connections: Moderately Isolated (06/10/2024)   Social Connection and Isolation Panel    Frequency of Communication with Friends and Family: Three times a week    Frequency of Social Gatherings with Friends and Family: Once a week    Attends Religious Services: More than 4 times per year    Active Member of [employer] or Organizations: No    Attends Engineer, Structural: Not on file    Marital Status: Divorced      Incomplete rbbb otherwise normal EKG: sinus rhythm with rate 60, rt axis, normal intervals, lvh hypertrophy, no st or t wave changes    Objective:  Physical  Exam:  I. General Appearance:      General: Well-developed, well-nourished, in no acute distress.      Level of Consciousness: Alert and oriented to person, place, and time.     BP 130/74   Pulse 75   Temp 98 F (36.7 C) (Temporal)   Ht 6' 1 (1.854 m)   Wt 245 lb (111.1 kg)   SpO2 98%   BMI 32.32 kg/m  Wt Readings from Last 3 Encounters:  07/11/24 245 lb (111.1 kg)  06/13/24 250 lb 12.8 oz (113.8 kg)  04/30/24 243 lb (110.2 kg)        BP Readings from Last 3 Encounters:  07/11/24 130/74  06/13/24 136/82  05/31/24 138/73    Clinical Frailty Scale: Very Fit: People who are robust, active, energetic, and motivated. These people commonly exercise regularly. They are among the fittest for their age.  II. Head, Eyes, Ears, Nose, and Throat (HEENT):      Head: Normocephalic, atraumatic (NCAT).     Eyes:         Pupils: Equal, round, reactive to light and accommodation (PERRLA).         Sclerae: Non-icteric.         Conjunctivae: Pink, no injection.         Extraocular Movements (EOMs): Intact.     I (soft palate, uvula, fauces, and tonsillar pillars visible)     Any conditions that might make intubation difficult (e.g., limited neck mobility, prominent overbite): no     Ears: External canals clear, tympanic membranes normal. (If examined)     Nose: Nasal mucosa moist, no discharge.     Throat: Oropharynx moist, no erythema or exudates.  III. Cardiovascular (CV):      Heart Rate and Rhythm: Regular rate and rhythm.  Pulse Readings from Last 1 Encounters:  07/11/24 75       Heart Sounds: No murmurs, rubs, or gallops. Specify S1 and S2 present.     Peripheral Pulses: Palpable and equal bilaterally (document location, e.g., radial, dorsalis pedis). If pulses are diminished or absent, document that as well.     Capillary Refill: <2 seconds.  IV. Respiratory:      Lungs: Clear to auscultation bilaterally. No wheezes, rales (crackles), or rhonchi.   SpO2 Readings  from Last 1 Encounters:  07/11/24 98%      Respiratory Effort:  No use of accessory muscles, no retractions .Normal chest wall movement .  V. Abdomen:      Abdomen: Soft, non-tender, non-distended.     Bowel Sounds: Present in all four quadrants.     No guarding, rigidity, or rebound tenderness.   VI. Musculoskeletal:      Extremities: minimal edema, no  cyanosis, or clubbing.     Range of Motion:  nearly full range of motion of neck but slight stiffness at extreme.     Skin: Warm, dry, intact. No rashes, lesions, or ulcers of concern near planned surgical site.     VII. Neurologic (Neuro):      Mental Status: Alert and oriented      Cranial Nerves: Grossly intact.     Motor:  No focal or generalized weakness apparent     Gait: (If assessed) Steady gait.  VIII. Psychiatric (Psych):      Mood and Affect: Appropriate.     Thought Process and Content: Normal. Demonstrates understanding.        Care coordination: A copy of this note will be forwarded to the requesting physician   Attestation:  I have personally spent  28 minutes involved in face-to-face and non-face-to-face activities for this patient on the day of the visit. Professional time spent includes the following activities:  Preparing to see the patient by reviewing medical records prior to and during the encounter; Obtaining, documenting, and reviewing an updated medical history; Performing a medically appropriate examination;  Evaluating, synthesizing, and documenting the available clinical information in the  EMR;  Coordinating/Communicating with other health care professionals; Independently interpreting results (not separately reported), Communicating, counseling, educating about results to the patient/family/caregiver (not separately reported); Collaboratively developing and communicating an individualized treatment plan with the patient; Placing medically necessary orders (for medications/tests/procedures/referrals);    This time was independent of any separately billable procedure(s).  The extended duration of this patient visit was medically necessary due to several factors:  The patient's health condition is multifaceted, requiring a comprehensive evaluation of patient and their past records to ensure accurate diagnosis and treatment planning for perioperative risk management.

## 2024-07-12 ENCOUNTER — Other Ambulatory Visit: Payer: Self-pay

## 2024-07-12 MED ORDER — EMPAGLIFLOZIN 10 MG PO TABS: 10.0000 mg | ORAL_TABLET | Freq: Every day | ORAL | 1 refills | Status: DC

## 2024-07-13 DIAGNOSIS — I4510 Unspecified right bundle-branch block: Secondary | ICD-10-CM | POA: Insufficient documentation

## 2024-07-13 DIAGNOSIS — E118 Type 2 diabetes mellitus with unspecified complications: Secondary | ICD-10-CM | POA: Insufficient documentation

## 2024-07-13 NOTE — Patient Instructions (Addendum)
 It was a pleasure seeing you today! Your health and satisfaction are our top priorities.  Charles Cone, MD  VISIT SUMMARY: You had a preoperative evaluation for your upcoming right total knee replacement surgery. We discussed your medical history, current medications, and the importance of managing your diabetes and hypertension to reduce surgical risks. We also reviewed your recent EKG and discussed the benefits and potential risks of the surgery. Additionally, we talked about your smoking cessation efforts and weight management.  YOUR PLAN: -PREOPERATIVE EVALUATION FOR RIGHT TOTAL KNEE REPLACEMENT DUE TO UNILATERAL PRIMARY OSTEOARTHRITIS, RIGHT KNEE: You are preparing for a right total knee replacement due to osteoarthritis, which is causing significant swelling and discomfort in your knee. The surgery is expected to improve your quality of life. We discussed the importance of controlling your diabetes and quitting smoking to reduce the risk of infection. Basic blood work has been ordered, and you should continue your current medications.  -TYPE 2 DIABETES MELLITUS WITH HYPERGLYCEMIA: Type 2 diabetes mellitus means your body has difficulty regulating blood sugar levels. You should continue taking metformin, glipizide , and Ozempic  as prescribed and monitor your blood glucose levels regularly to reduce surgical risks.  -ESSENTIAL HYPERTENSION: Hypertension, or high blood pressure, is being managed with lisinopril . Your blood pressure is acceptable for surgery, and you should continue taking lisinopril  as prescribed.  -OBESITY: Obesity can increase health risks, including swelling in your legs. You should continue using Ozempic  for weight management and work on losing weight to reduce surgical risks.  -EDEMA OF LOWER EXTREMITIES: Edema is swelling caused by fluid buildup. Your leg swelling may be related to obesity. We have ordered a test to check for heart-related causes. Continue your weight  management efforts.  -SHORTNESS OF BREATH WITH EXERTION: You experience shortness of breath when active, which should improve with smoking cessation. Continue to avoid smoking to improve your respiratory function.  -HISTORY OF TOBACCO USE, CURRENTLY ABSTINENT: You have quit smoking, which will improve your surgical outcomes and overall health. Continue your efforts to remain smoke-free.  -CERVICAL RADICULOPATHY AND NECK PAIN: Cervical radiculopathy is nerve pain in the neck. Your symptoms have improved with a memory foam pillow. Continue using the pillow for neck support.  -ESSENTIAL TREMOR: Essential tremor is a condition that causes involuntary shaking. It is well-controlled with propranolol , which you should continue taking.  -GENERAL HEALTH MAINTENANCE: A colonoscopy referral for cancer screening is pending, and you have a dermatology appointment scheduled for skin lesion evaluation. Please attend these appointments.  INSTRUCTIONS: Please follow up with your primary care physician after your knee replacement surgery. Continue taking your current medications and monitor your blood glucose levels regularly. Attend your dermatology appoin tment and the colonoscopy screening as scheduled.  Your Providers PCP: Payne Charles MATSU, MD,  [phone]) Referring Provider: Cone Charles MATSU, MD,  [phone])  NEXT STEPS: [x]  Early Intervention: Schedule sooner appointment, call our on-call services, or go to emergency room if there is any significant Increase in pain or discomfort New or worsening symptoms Sudden or severe changes in your health [x]  Flexible Follow-Up: We recommend a No follow-ups on file. for optimal routine care. This allows for progress monitoring and treatment adjustments. [x]  Preventive Care: Schedule your annual preventive care visit! It's typically covered by insurance and helps identify potential health issues early. [x]  Lab & X-ray Appointments: Incomplete tests  scheduled today, or call to schedule. X-rays: [facility] (M-F, 8:30am-noon or 1pm-5pm). [x]  Medical Information Release: Sign a release form at front  desk to obtain relevant medical information we don't have.  MAKING THE MOST OF OUR FOCUSED 20 MINUTE APPOINTMENTS: [x]   Clearly state your top concerns at the beginning of the visit to focus our discussion [x]   If you anticipate you will need more time, please inform the front desk during scheduling - we can book multiple appointments in the same week. [x]   If you have transportation problems- use our convenient video appointments or ask about transportation support. [x]   We can get down to business faster if you use MyChart to update information before the visit and submit non-urgent questions before your visit. Thank you for taking the time to provide details through MyChart.  Let our nurse know and she can import this information into your encounter documents.  Arrival and Wait Times: [x]   Arriving on time ensures that everyone receives prompt attention. [x]   Early morning (8a) and afternoon (1p) appointments tend to have shortest wait times. [x]   Unfortunately, we cannot delay appointments for late arrivals or hold slots during phone calls.  Getting Answers and Following Up [x]   Simple Questions & Concerns: For quick questions or basic follow-up after your visit, reach us  at [phone] or MyChart messaging. [x]   Complex Concerns: If your concern is more complex, scheduling an appointment might be best. Discuss this with the staff to find the most suitable option. [x]   Lab & Imaging Results: We'll contact you directly if results are abnormal or you don't use MyChart. Most normal results will be on MyChart within 2-3 business days, with a review message from Dr. [name]. Haven't heard back in 2 weeks? Need results sooner? Contact us  at [phone]. [x]   Referrals: Our referral coordinator will manage specialist referrals.  The specialist's office should contact you within 2 weeks to schedule an appointment. Call us  if you haven't heard from them after 2 weeks.  Staying Connected [x]   MyChart: Activate your MyChart for the fastest way to access results and message us . See the last page of this paperwork for instructions on how to activate.  Bring to Your Next Appointment [x]   Medications: Please bring all your medication bottles to your next appointment to ensure we have an accurate record of your prescriptions. [x]   Health Diaries: If you're monitoring any health conditions at home, keeping a diary of your readings can be very helpful for discussions at your next appointment.  Billing [x]   X-ray & Lab Orders: These are billed by separate companies. Contact the invoicing company directly for questions or concerns. [x]   Visit Charges: Discuss any billing inquiries with our administrative services team.  Your Satisfaction Matters [x]   Share Your Experience: We strive for your satisfaction! If you have any complaints, or preferably compliments, please let Dr. [name] know directly or contact our Practice Administrators, Manuelita Rubin or [employer], by asking at the front desk.   Reviewing Your Records [x]   Review this early draft of your clinical encounter notes below and the final encounter summary tomorrow on MyChart after its been completed.  All orders placed so far are visible here: Preop examination -     EKG 12-Lead -     Comprehensive metabolic panel with GFR -     Brain natriuretic peptide  Incomplete right bundle branch block (RBBB)  Essential hypertension -     Lisinopril ; Take 1 tablet (5 mg total) by mouth in the morning.  Dispense: 90 tablet; Refill: 0  Screening for malignant neoplasm of colon -  Ambulatory referral to Gastroenterology  Arthritis of knee -     Brain natriuretic peptide  Type 2 diabetes mellitus with hyperglycemia, without long-term current use of insulin (HCC) -      Lisinopril ; Take 1 tablet (5 mg total) by mouth in the morning.  Dispense: 90 tablet; Refill: 0  Swelling -     Brain natriuretic peptide  Short of breath on exertion -     EKG 12-Lead -     Comprehensive metabolic panel with GFR -     Brain natriuretic peptide  Controlled diabetes mellitus type 2 with complications (HCC) -     Lipid panel -     Comprehensive metabolic panel with GFR -     CBC with Differential/Platelet -     Microalbumin / creatinine urine ratio    ?? ADVANCED EPIC RTF FORMATTING SHOWCASE  Pushing the boundaries of what's possible in Epic's RTF constraints - Table-Based Edition   ?? SHOWCASE 1: ADVANCED LAB TRENDS WITH VISUAL ANALYTICS   LONGITUDINAL METABOLIC PANEL ANALYSIS   A1C (%) 7.8 7.2 6.8 ?? Improving  <7.0% ? AT GOAL  LDL (mg/dL) 861 897 95 ? Improving  <100 ? AT GOAL  eGFR (mL/min) 62 58 55 ? Declining  >60 ? CKD 3A  ALT (U/L) 32 78 125 ?? RISING  <56 ? ABNORMAL  Potassium (mEq/L) 4.1 4.3 4.2 ? Stable  3.5-5.0 ? Normal  BP (systolic) 148 142 138 ? Improving  <130 ? CLOSE         ?? SHOWCASE 2: COMPREHENSIVE SYSTEMS REVIEW MATRIX      System Status Positive Findings / Symptoms Clinical Significance ? CONSTITUTIONAL ?    Fatigue for 3 weeks (7/10 severity)   Weight loss: 8 lbs in 2 months (unintentional)    Denies fever, chills, night sweats  Significant: Unintentional weight loss warrants workup  ?? EYES ?   No vision changes, pain, redness, or discharge  Last eye exam: 10/2021 (due soon)  Unremarkable  ?? ENT ?   No hearing loss, tinnitus, sinus symptoms  No sore throat or hoarseness  Unremarkable  ? CARDIOVASCULAR ?    Occasional palpitations (2-3x/week)    Denies chest pain, orthopnea, PND   No edema or claudication  Monitor: Consider event monitor if persistent  ?? RESPIRATORY ?   No dyspnea, cough, wheezing  No hemoptysis or sputum production  Unremarkable  ??  GASTROINTESTINAL ?    Abdominal pain: RUQ, intermittent, worse after meals    Nausea (no vomiting)   No diarrhea, constipation, melena, or hematochezia  ACTION NEEDED: RUQ pain + elevated ALT ? Hepatobiliary workup urgent  ?? GENITOURINARY ?    Increased urinary frequency (8-10x/day)    Denies dysuria, hematuria, incontinence   No flank pain or urgency  Correlate: Likely hyperglycemia-related, improving with A1C control  ?? MUSCULOSKELETAL ?   No joint pain, swelling, stiffness  No muscle weakness or back pain  Full ROM, no functional limitations  Unremarkable  ?? SKIN ?   No rashes, lesions, or wounds  No changes in moles or new growths  Unremarkable  ?? NEUROLOGICAL ?   No headaches, dizziness, syncope  No weakness, numbness, or tremor  No seizures or memory changes  Unremarkable  ?? PSYCHIATRIC ?    Mild anxiety related to health concerns   Sleep quality good   Denies depression, SI/HI  Monitor: Situational anxiety, reassurance appropriate  ? ENDOCRINE ?   No heat/cold intolerance  No polydipsia (improved from baseline)  No hair changes or voice changes  Improving: Diabetes symptoms better controlled  ?? HEMATOLOGIC ?    Easy bruising (new onset, past month)   No bleeding or lymphadenopathy   No history of clots or transfusions  Correlate: Check CBC, PT/INR given easy bruising + liver enzyme elevation  ?? ALLERGIC/IMMUNOLOGIC ?   No allergies or immunodeficiency  No recurrent infections  Vaccines up to date  Unremarkable       ?? SHOWCASE 3: MEDICATION TIMELINE & ADHERENCE TRACKER     ?? METFORMIN 1000MG  BID - Diabetes Management   08/2021 500mg  BID 75% (GI issues) Moderate GI upset A1C 8.2% (baseline) 11/2021 850mg  BID 85% (improving) Mild GI (tolerating) A1C 7.8% (5% reduction) 02/2022 1000mg  BID 95% (excellent) Minimal/none A1C 7.2% (13% reduction) 05/2023 1000mg  BID 98% (excellent) None reported A1C 6.8% ? AT GOAL    ??  LISINOPRIL  - Hypertension Management   08/2021 START 5mg  daily 152/92 ? 145/88 (modest response)  K+ 4.1, Cr 1.0 Well tolerated, no cough 11/2021 ? 10mg  daily 145/88 ? 142/86 (stable-high)  K+ 4.3, Cr 1.1 Added HCTZ 25mg  02/2022 10mg  daily (continued) 142/86 ? 138/84 (improving)  K+ 4.2, Cr 1.1 HCTZ benefit minimal 05/2023 ? 20mg  daily D/C HCTZ 138/84 ? pending (target <130/80)  K+ 4.2, Cr 1.2 eGFR 55 (CKD3a) Optimize ACE-I before add-on; monitor closely   ?? ATORVASTATIN 40MG  - Lipid Management   11/2021 START 40mg  daily 138 mg/dL (baseline) (above goal)  ALT 32, CK normal No muscle symptoms 02/2022 40mg  daily 138 ? 102 mg/dL (73% reduction)  ALT 78 (?statin) CK normal Mild leg cramps (questioned statin vs. dehydration) 05/2023 40mg  daily 102 ? 95 mg/dL ? AT GOAL  ALT 125 (2.2x ULN) CK pending Cramps resolved (hydration); no myalgias currently   ?? MEDICATION ADHERENCE SUMMARY                                                                                                                                                                                                                                                          Metformin 1000mg  BID 98%  Initial GI intolerance Gradual titration, take with food Excellent  Lisinopril  20mg  daily 100%  None Morning routine linking Excellent  Atorvastatin 40mg  HS 95%  Occasional forgetfulness Bedtime alarm, pill organizer Very Good  Aspirin 81mg   daily 100%  None Take with lisinopril  AM Excellent    ?? SHOWCASE 4: DIFFERENTIAL DIAGNOSIS DECISION MATRIX  ?? DIFFERENTIAL DIAGNOSIS RANKED BY PROBABILITY   ? SUPPORTING EVIDENCE:  BMI 31.2 (obesity) Type 2  diabetes mellitus Dyslipidemia (pre-treatment) Metabolic syndrome constellation ALT > AST pattern (ratio 1.3) RUQ discomfort after fatty meals ? AGAINST:  Rapid rise (3 months) unusual for isolated NAFLD Weight loss (typically gain in NAFLD) No prior documented steatosis ? UNCERTAIN:   ?? DIAGNOSTIC WORKUP:    Ordered: RUQ ultrasound with Doppler (assess steatosis, exclude biliary obstruction)   Calculate: FIB-4 and APRI scores (fibrosis risk stratification)   Consider: FibroScan if available (non-invasive fibrosis assessment)   If confirmed: Hepatology referral for NASH management, possible biopsy   ?? DIAGNOSTIC STRATEGY & ACTION PLAN  ?? CLINICAL BOTTOM LINE:  NAFLD is the most likely diagnosis (70%) given metabolic syndrome and hepatocellular pattern. However, comprehensive workup necessary to rule out viral hepatitis (2% lifetime prevalence in birth cohort), medication effect, and rare metabolic/autoimmune causes. Initial results will guide specialist referral decisions and treatment approach. Patient counseled on importance of workup completion and follow-up.   ?? SHOWCASE 5: CLINICAL DECISION PATHWAY    Current BP: 142/88 mmHg  Goal: <130/80 mmHg Patient: 58M with T2DM, CKD stage 3A (eGFR 55)   ?    ?   ?  ?? MONITORING & REASSESSMENT PROTOCOL  Week 2 Home BP monitoring (avg of 5 days) <135/85 at home Phone check-in, consider further titration Week 4 Office visit: BP check + labs (BMP) <130/80 office K+ 3.5-5.0 Cr stable If BP high: increase to 30mg  If K+ elevated: reduce dose Week 8 Office visit if dose adjusted at week 4 Goal BP achieved Labs stable Add CCB if still not at goal Month 3 Routine follow-up if stable at week 4 Sustained BP control Continue current regimen, monitor q70mo

## 2024-07-15 ENCOUNTER — Telehealth: Payer: Self-pay

## 2024-07-15 ENCOUNTER — Encounter: Payer: Self-pay | Admitting: Dermatology

## 2024-07-15 ENCOUNTER — Other Ambulatory Visit (HOSPITAL_COMMUNITY): Payer: Self-pay

## 2024-07-15 ENCOUNTER — Ambulatory Visit (INDEPENDENT_AMBULATORY_CARE_PROVIDER_SITE_OTHER): Admitting: Dermatology

## 2024-07-15 VITALS — BP 146/78 | HR 69

## 2024-07-15 DIAGNOSIS — L570 Actinic keratosis: Secondary | ICD-10-CM

## 2024-07-15 DIAGNOSIS — W908XXA Exposure to other nonionizing radiation, initial encounter: Secondary | ICD-10-CM

## 2024-07-15 NOTE — Patient Instructions (Signed)

## 2024-07-15 NOTE — Progress Notes (Signed)
   New Patient Visit   History of Present Illness Charles Payne is a 63 year old male who presents with precancerous skin lesions on his cheeks and near his eye. He was referred by his doctor for evaluation of skin lesions on his cheeks.  He has had lesions on his cheeks for years, which he describes as feeling like sandpaper and gritty. Recently, he noticed them visually. A similar lesion is present near his eye.  He uses sunscreen with SPF 30 or more, but sometimes opts for SPF 15 depending on his skin tone. [place] time [place] the beach during the [place].  He recalls having warts removed with liquid nitrogen as a child and is familiar with the procedure. He is concerned about the appearance of blisters and scabs following treatment, especially with upcoming social events like Thanksgiving.  He is planning for knee replacement surgery and has previously undergone hip replacement surgery, which he found life-changing.  The following portions of the chart were reviewed this encounter and updated as appropriate: medications, allergies, medical history  Review of Systems:  No other skin or systemic complaints except as noted in HPI or Assessment and Plan.  Objective  Well appearing patient in no apparent distress; mood and affect are within normal limits.  A focused examination was performed of the following areas: Face  Relevant exam findings are noted in the Assessment and Plan.  Left Ear, Left Eyebrow, Left Malar Cheek, Left Zygomatic Area, Right Temple, Right Zygomatic Area Erythematous thin papules/macules with gritty scale.   Assessment & Plan   ACTINIC KERATOSIS Exam: Erythematous thin papules/macules with gritty scale  Actinic keratoses are precancerous spots that appear secondary to cumulative UV radiation exposure/sun exposure over time. They are chronic with expected duration over 1 year. A portion of actinic keratoses will progress to squamous cell carcinoma of the skin. It  is not possible to reliably predict which spots will progress to skin cancer and so treatment is recommended to prevent development of skin cancer.  Recommend daily broad spectrum sunscreen SPF 30+ to sun-exposed areas, reapply every 2 hours as needed.  Recommend staying in the shade or wearing long sleeves, sun glasses (UVA+UVB protection) and wide brim hats (4-inch brim around the entire circumference of the hat). Call for new or changing lesions.  Treatment Plan:  Prior to procedure, discussed risks of blister formation, small wound, skin dyspigmentation, or rare scar following cryotherapy. Recommend Vaseline ointment to treated areas while healing.  Destruction Procedure Note Destruction method: cryotherapy   Informed consent: discussed and consent obtained   Lesion destroyed using liquid nitrogen: Yes   Outcome: patient tolerated procedure well with no complications   Post-procedure details: wound care instructions given    AK (ACTINIC KERATOSIS) (6) Left Ear, Left Eyebrow, Left Malar Cheek, Left Zygomatic Area, Right Temple, Right Zygomatic Area Destruction of lesion - Left Ear, Left Eyebrow, Left Malar Cheek, Left Zygomatic Area, Right Temple, Right Zygomatic Area Complexity: simple   Destruction method: cryotherapy   Informed consent: discussed and consent obtained   Timeout:  patient name, date of birth, surgical site, and procedure verified Outcome: patient tolerated procedure well with no complications   Post-procedure details: wound care instructions given     Return for FBSE.  Documentation: I have reviewed the above documentation for accuracy and completeness, and I agree with the above.  RUFUS CHRISTELLA HOLY, MD

## 2024-07-15 NOTE — Telephone Encounter (Signed)
 Pharmacy Patient Advocate Encounter   Received notification from Onbase that prior authorization for Jardiance  10MG  tablets is required/requested.   Insurance verification completed.   The patient is insured through [facility] MEDICAID.   Per test claim: Refill too soon. PA is not needed at this time. Medication was filled 07/12/24. Next eligible fill date is 08/04/14.

## 2024-07-19 ENCOUNTER — Ambulatory Visit (INDEPENDENT_AMBULATORY_CARE_PROVIDER_SITE_OTHER): Payer: Worker's Compensation | Admitting: Plastic Surgery

## 2024-07-19 DIAGNOSIS — S01511A Laceration without foreign body of lip, initial encounter: Secondary | ICD-10-CM

## 2024-07-19 NOTE — Progress Notes (Signed)
 Preoperative Dx: scar upper lip  Postoperative Dx:  same  Procedure: laser to upper lip scar   Anesthesia: none  Description of Procedure:  Risks and complications were explained to the patient. Consent was confirmed and signed. Eye protection was placed. Time out was called and all information was confirmed to be correct. The area  area was prepped with alcohol and wiped dry. The TRL laser was set at 30. The upper lip area was lasered. The patient tolerated the procedure well and there were no complications. The patient is to follow up in 4 weeks.

## 2024-07-30 ENCOUNTER — Ambulatory Visit

## 2024-07-30 VITALS — Ht 73.0 in | Wt 245.0 lb

## 2024-07-30 DIAGNOSIS — Z1211 Encounter for screening for malignant neoplasm of colon: Secondary | ICD-10-CM

## 2024-07-30 MED ORDER — ONDANSETRON HCL 4 MG PO TABS: 4.0000 mg | ORAL_TABLET | Freq: Three times a day (TID) | ORAL | 1 refills | Status: AC | PRN

## 2024-07-30 MED ORDER — NA SULFATE-K SULFATE-MG SULF 17.5-3.13-1.6 GM/177ML PO SOLN: 1.0000 | Freq: Once | ORAL | 0 refills | Status: AC

## 2024-07-30 NOTE — Progress Notes (Signed)
 No issues known to pt with past sedation with any surgeries or procedures Patient denies ever being told they had issues or difficulty with intubation  No FH of Malignant Hyperthermia Pt is not on diet pills; is taking GLP-1 medication Pt is not on  home 02  Pt is not on blood thinners  Pt denies issues with constipation  No A fib or A flutter Have any cardiac testing pending--no Pt instructed to use [email or web address] or GoodRx for a price reduction on prep  Ambulates independently

## 2024-08-06 ENCOUNTER — Other Ambulatory Visit (HOSPITAL_COMMUNITY): Payer: Self-pay

## 2024-08-13 ENCOUNTER — Encounter: Payer: Self-pay | Admitting: Gastroenterology

## 2024-08-13 ENCOUNTER — Ambulatory Visit: Admitting: Gastroenterology

## 2024-08-13 VITALS — BP 129/79 | HR 64 | Temp 97.7°F | Resp 12 | Ht 73.0 in | Wt 245.0 lb

## 2024-08-13 DIAGNOSIS — D124 Benign neoplasm of descending colon: Secondary | ICD-10-CM

## 2024-08-13 DIAGNOSIS — Z1211 Encounter for screening for malignant neoplasm of colon: Secondary | ICD-10-CM

## 2024-08-13 DIAGNOSIS — D128 Benign neoplasm of rectum: Secondary | ICD-10-CM

## 2024-08-13 DIAGNOSIS — D123 Benign neoplasm of transverse colon: Secondary | ICD-10-CM

## 2024-08-13 DIAGNOSIS — K562 Volvulus: Secondary | ICD-10-CM | POA: Diagnosis not present

## 2024-08-13 DIAGNOSIS — K5730 Diverticulosis of large intestine without perforation or abscess without bleeding: Secondary | ICD-10-CM

## 2024-08-13 DIAGNOSIS — D122 Benign neoplasm of ascending colon: Secondary | ICD-10-CM

## 2024-08-13 DIAGNOSIS — D120 Benign neoplasm of cecum: Secondary | ICD-10-CM

## 2024-08-13 MED ORDER — SODIUM CHLORIDE 0.9 % IV SOLN: 500.0000 mL | Freq: Once | INTRAVENOUS | Status: DC

## 2024-08-13 NOTE — Progress Notes (Signed)
 [facility] History and Physical   Primary Care Physician:  [name], MD   Reason for Procedure:   Colon cancer screening  Plan:    Screening colonoscopy   HPI: Charles Payne is a 63 y.o. male undergoing average risk screening colonoscopy.  He has no family history of colon cancer and no chronic GI symptoms.  He reports having a colonoscopy in 2005 in [place] and thinks a polyp may have been removed.  He has ventral hernia on exam. We discussed the increased risk and difficulty involved in a colonoscopy if this hernia involves a loop of colon, which is rare but possible.  The patient was provided an opportunity to ask questions and all were answered. The patient agreed with the plan   Past Medical History:  Diagnosis Date   Arthritis    Diabetes (HCC)    Lumbar facet arthropathy 02/11/2020   Migraines    Mixed hyperlipidemia    Obesity (BMI 35.0-39.9 without comorbidity) 04/02/2009   Formatting of this note might be different from the original. Obesity 10/1 IMO update  Formatting of this note might be different from the original. Obesity 10/1 IMO update     Personal history of tobacco use, presenting hazards to health 06/08/2023   Pulmonary embolism (HCC) 02/12/2024   Tremor of both hands     Past Surgical History:  Procedure Laterality Date   KNEE ARTHROSCOPY W/ DEBRIDEMENT Right    PARTIAL HIP ARTHROPLASTY  2021    Prior to Admission medications  Medication Sig Start Date End Date Taking? Authorizing Provider  aspirin 81 MG chewable tablet Chew 81 mg by mouth daily. 06/08/23  Yes [provider]  empagliflozin  (JARDIANCE ) 10 MG TABS tablet Take 1 tablet (10 mg total) by mouth daily. Patient not taking: No sig reported 07/12/24   [name], MD  glipiZIDE  (GLUCOTROL ) 10 MG tablet Take 1 tablet (10 mg total) by mouth 2 (two) times daily. 05/14/24  Yes [name], MD  lisinopril  (ZESTRIL ) 5 MG tablet Take 1 tablet (5 mg total) by  mouth in the morning. 07/11/24  Yes [name], MD  metFORMIN (GLUCOPHAGE) 1000 MG tablet TAKE 1 TABLET BY MOUTH TWICE A DAY WITH MORNING AND EVENING MEALS Patient taking differently: Take 500 mg by mouth 2 (two) times daily with a meal. 10/01/19  Yes [provider]  nicotine  polacrilex (COMMIT) 2 MG lozenge RX #2 Weeks 7-9: 1 lozenge every 2-4 hours.Max 20 lozenges per day. 06/13/24  Yes [name], MD  ondansetron  (ZOFRAN ) 4 MG tablet Take 1 tablet (4 mg total) by mouth every 8 (eight) hours as needed for nausea or vomiting. 07/30/24  Yes Stacia Glendia BRAVO, MD  propranolol  ER (INDERAL  LA) 60 MG 24 hr capsule Take 1 capsule (60 mg total) by mouth daily. Replaces 20 mg dosing 06/13/24  Yes [name], MD  rosuvastatin  (CRESTOR ) 20 MG tablet Take 1 tablet (20 mg total) by mouth daily. 04/09/24  Yes [name], MD  Blood Glucose Monitoring Suppl DEVI 1 each by Does not apply route in the morning, at noon, and at bedtime. May substitute to any manufacturer covered by patient's insurance. Patient not taking: No sig reported 08/11/23   Crain, Whitney L, PA  Semaglutide ,0.25 or 0.5MG /DOS, (OZEMPIC , 0.25 OR 0.5 MG/DOSE,) 2 MG/3ML SOPN Inject 0.5 mg into the skin once a week. 06/13/24   [name], MD  triamcinolone  cream (KENALOG ) 0.5 % Apply 1 Application topically 2 (  two) times daily. To affected areas, do not use >14 consecutive days Patient not taking: No sig reported 09/08/23   Lowella Folks L, PA    Current Outpatient Medications  Medication Sig Dispense Refill   aspirin 81 MG chewable tablet Chew 81 mg by mouth daily.     empagliflozin  (JARDIANCE ) 10 MG TABS tablet Take 1 tablet (10 mg total) by mouth daily. (Patient not taking: No sig reported) 90 tablet 1   glipiZIDE  (GLUCOTROL ) 10 MG tablet Take 1 tablet (10 mg total) by mouth 2 (two) times daily. 180 tablet 4   lisinopril  (ZESTRIL ) 5 MG tablet Take 1 tablet (5 mg total) by mouth in the morning. 90 tablet 0    metFORMIN (GLUCOPHAGE) 1000 MG tablet TAKE 1 TABLET BY MOUTH TWICE A DAY WITH MORNING AND EVENING MEALS (Patient taking differently: Take 500 mg by mouth 2 (two) times daily with a meal.)     nicotine  polacrilex (COMMIT) 2 MG lozenge RX #2 Weeks 7-9: 1 lozenge every 2-4 hours.Max 20 lozenges per day. 252 lozenge 0   ondansetron  (ZOFRAN ) 4 MG tablet Take 1 tablet (4 mg total) by mouth every 8 (eight) hours as needed for nausea or vomiting. 30 tablet 1   propranolol  ER (INDERAL  LA) 60 MG 24 hr capsule Take 1 capsule (60 mg total) by mouth daily. Replaces 20 mg dosing 90 capsule 3   rosuvastatin  (CRESTOR ) 20 MG tablet Take 1 tablet (20 mg total) by mouth daily. 90 tablet 3   Blood Glucose Monitoring Suppl DEVI 1 each by Does not apply route in the morning, at noon, and at bedtime. May substitute to any manufacturer covered by patient's insurance. (Patient not taking: No sig reported) 1 each 0   Semaglutide ,0.25 or 0.5MG /DOS, (OZEMPIC , 0.25 OR 0.5 MG/DOSE,) 2 MG/3ML SOPN Inject 0.5 mg into the skin once a week. 3 mL 3   triamcinolone  cream (KENALOG ) 0.5 % Apply 1 Application topically 2 (two) times daily. To affected areas, do not use >14 consecutive days (Patient not taking: No sig reported) 30 g 3   Current Facility-Administered Medications  Medication Dose Route Frequency Provider Last Rate Last Admin   0.9 %  sodium chloride  infusion  500 mL Intravenous Once Stacia Glendia BRAVO, MD        Allergies as of 08/13/2024   (No Known Allergies)    Family History  Problem Relation Age of Onset   Melanoma Mother    Esophageal cancer Neg Hx    Rectal cancer Neg Hx    Stomach cancer Neg Hx    Colon polyps Neg Hx     Social History   Socioeconomic History   Marital status: Divorced    Spouse name: Not on file   Number of children: 2   Years of education: Not on file   Highest education level: Some college, no degree  Occupational History   Not on file  Tobacco Use   Smoking status:  Former    Current packs/day: 0.00    Types: Cigarettes    Quit date: 1973    Years since quitting: 53.0   Smokeless tobacco: Never  Vaping Use   Vaping status: Never Used  Substance and Sexual Activity   Alcohol use: Yes    Alcohol/week: 4.0 standard drinks of alcohol    Types: 4 Cans of beer per week   Drug use: Yes    Types: Marijuana    Comment: every couple of months, most recently used 11/2023   Sexual  activity: Not on file  Other Topics Concern   Not on file  Social History Narrative   One grandchild    Social Drivers of Health   Tobacco Use: Medium Risk (08/13/2024)   Patient History    Smoking Tobacco Use: Former    Smokeless Tobacco Use: Never    Passive Exposure: Not on file  Financial Resource Strain: Medium Risk (06/10/2024)   Overall Financial Resource Strain (CARDIA)    Difficulty of Paying Living Expenses: Somewhat hard  Food Insecurity: No Food Insecurity (06/10/2024)   Epic    Worried About Programme Researcher, Broadcasting/film/video in the Last Year: Never true    Ran Out of Food in the Last Year: Never true  Recent Concern: Food Insecurity - Food Insecurity Present (04/05/2024)   Epic    Worried About Programme Researcher, Broadcasting/film/video in the Last Year: Sometimes true    Ran Out of Food in the Last Year: Sometimes true  Transportation Needs: No Transportation Needs (06/10/2024)   Epic    Lack of Transportation (Medical): No    Lack of Transportation (Non-Medical): No  Physical Activity: Insufficiently Active (06/10/2024)   Exercise Vital Sign    Days of Exercise per Week: 3 days    Minutes of Exercise per Session: 30 min  Stress: No Stress Concern Present (06/10/2024)   [employer] of Occupational Health - Occupational Stress Questionnaire    Feeling of Stress: Only a little  Social Connections: Moderately Isolated (06/10/2024)   Social Connection and Isolation Panel    Frequency of Communication with Friends and Family: Three times a week    Frequency of Social Gatherings  with Friends and Family: Once a week    Attends Religious Services: More than 4 times per year    Active Member of [employer] or Organizations: No    Attends Engineer, Structural: Not on file    Marital Status: Divorced  Intimate Partner Violence: Not on file  Depression (PHQ2-9): Low Risk (04/09/2024)   Depression (PHQ2-9)    PHQ-2 Score: 0  Alcohol Screen: Low Risk (06/10/2024)   Alcohol Screen    Last Alcohol Screening Score (AUDIT): 4  Housing: Unknown (06/10/2024)   Epic    Unable to Pay for Housing in the Last Year: No    Number of Times Moved in the Last Year: Not on file    Homeless in the Last Year: No  Utilities: Not on file  Health Literacy: Not on file    Review of Systems:  All other review of systems negative except as mentioned in the HPI.  Physical Exam: Vital signs BP (!) 141/72   Pulse 62   Temp 97.7 F (36.5 C)   Ht 6' 1 (1.854 m)   Wt 245 lb (111.1 kg)   SpO2 98%   BMI 32.32 kg/m   General:   Alert,  Well-developed, well-nourished, pleasant and cooperative in NAD Airway:  Mallampati 3 Lungs:  Clear throughout to auscultation.   Heart:  Regular rate and rhythm; no murmurs, clicks, rubs,  or gallops. Abdomen:  Soft, nontender and nondistended. Normal bowel sounds.  Anterior midline abdominal, superior to umbilicus, reducible, nontender Neuro/Psych:  Normal mood and affect. A and O x 3   Charles Payne E. Stacia, MD [facility]

## 2024-08-13 NOTE — Patient Instructions (Addendum)
 Resume previous diet.                           - Continue present medications.                           - Await pathology results.                           - Repeat colonoscopy in 1 year for surveillance. Handout on polyps given.     YOU HAD AN ENDOSCOPIC PROCEDURE TODAY AT THE [facility]:   Refer to the procedure report that was given to you for any specific questions about what was found during the examination.  If the procedure report does not answer your questions, please call your gastroenterologist to clarify.  If you requested that your care partner not be given the details of your procedure findings, then the procedure report has been included in a sealed envelope for you to review at your convenience later.  YOU SHOULD EXPECT: Some feelings of bloating in the abdomen. Passage of more gas than usual.  Walking can help get rid of the air that was put into your GI tract during the procedure and reduce the bloating. If you had a lower endoscopy (such as a colonoscopy or flexible sigmoidoscopy) you may notice spotting of blood in your stool or on the toilet paper. If you underwent a bowel prep for your procedure, you may not have a normal bowel movement for a few days.  Please Note:  You might notice some irritation and congestion in your nose or some drainage.  This is from the oxygen used during your procedure.  There is no need for concern and it should clear up in a day or so.  SYMPTOMS TO REPORT IMMEDIATELY:  Following lower endoscopy (colonoscopy or flexible sigmoidoscopy):  Excessive amounts of blood in the stool  Significant tenderness or worsening of abdominal pains  Swelling of the abdomen that is new, acute  Fever of 100F or higher   For urgent or emergent issues, a gastroenterologist can be reached at any hour by calling [phone]. Do not use MyChart messaging for urgent concerns.    DIET:  We do recommend a small meal at first, but then you may  proceed to your regular diet.  Drink plenty of fluids but you should avoid alcoholic beverages for 24 hours.  ACTIVITY:  You should plan to take it easy for the rest of today and you should NOT DRIVE or use heavy machinery until tomorrow (because of the sedation medicines used during the test).    FOLLOW UP: Our staff will call the number listed on your records the next business day following your procedure.  We will call around 7:15- 8:00 am to check on you and address any questions or concerns that you may have regarding the information given to you following your procedure. If we do not reach you, we will leave a message.     If any biopsies were taken you will be contacted by phone or by letter within the next 1-3 weeks.  Please call us  at [phone] if you have not heard about the biopsies in 3 weeks.    SIGNATURES/CONFIDENTIALITY: You and/or your care partner have signed paperwork which will be entered into your electronic medical record.  These signatures attest to the fact that  that the information above on your After Visit Summary has been reviewed and is understood.  Full responsibility of the confidentiality of this discharge information lies with you and/or your care-partner.

## 2024-08-13 NOTE — Progress Notes (Signed)
 Called to room to assist during endoscopic procedure.  Patient ID and intended procedure confirmed with present staff. Received instructions for my participation in the procedure from the performing physician.

## 2024-08-13 NOTE — Progress Notes (Signed)
 Vss nad trans to pacu

## 2024-08-13 NOTE — Progress Notes (Signed)
 Pt's states no medical or surgical changes since previsit or office visit.

## 2024-08-13 NOTE — Op Note (Signed)
 [facility] Patient Name: Charles Payne Procedure Date: 08/13/2024 9:15 AM MRN: [id number] Endoscopist: Glendia E. Stacia , MD, [id number] Age: 63 Referring MD:  Date of Birth: Jun 15, 1961 Gender: Male Account #: [id number] Procedure:                Colonoscopy Indications:              Screening for colorectal malignant neoplasm (last                            colonoscopy was more than 10 years ago) Medicines:                Monitored Anesthesia Care Procedure:                Pre-Anesthesia Assessment:                           - Prior to the procedure, a History and Physical                            was performed, and patient medications and                            allergies were reviewed. The patient's tolerance of                            previous anesthesia was also reviewed. The risks                            and benefits of the procedure and the sedation                            options and risks were discussed with the patient.                            All questions were answered, and informed consent                            was obtained. Prior Anticoagulants: The patient has                            taken no anticoagulant or antiplatelet agents. ASA                            Grade Assessment: III - A patient with severe                            systemic disease. After reviewing the risks and                            benefits, the patient was deemed in satisfactory                            condition to undergo the procedure.  After obtaining informed consent, the colonoscope                            was passed under direct vision. Throughout the                            procedure, the patient's blood pressure, pulse, and                            oxygen saturations were monitored continuously. The                            Olympus CF-HQ190L ([id number]) Colonoscope was                            introduced  through the anus and advanced to the the                            cecum, identified by appendiceal orifice and                            ileocecal valve. The colonoscopy was performed with                            moderate difficulty due to inadequate bowel prep, a                            redundant colon and significant looping. Successful                            completion of the procedure was aided by using                            manual pressure. The patient tolerated the                            procedure well. The bowel preparation used was                            SUPREP via split dose instruction. Scope In: 9:37:29 AM Scope Out: 10:16:19 AM Scope Withdrawal Time: 0 hours 31 minutes 58 seconds  Total Procedure Duration: 0 hours 38 minutes 50 seconds  Findings:                 The perianal and digital rectal examinations were                            normal. Pertinent negatives include normal                            sphincter tone and no palpable rectal lesions.                           Two sessile polyps were found in the cecum. The  polyps were 5 to 15 mm in size. These polyps were                            removed with a cold snare. Resection and retrieval                            were complete. Estimated blood loss was minimal.                           A 5 mm polyp was found in the ascending colon. The                            polyp was sessile. The polyp was removed with a                            cold snare. Resection and retrieval were complete.                            Estimated blood loss was minimal.                           Six sessile polyps were found in the transverse                            colon. The polyps were 3 to 10 mm in size. These                            polyps were removed with a cold snare. Resection                            and retrieval were complete. Estimated blood loss                             was minimal.                           A 12 mm polyp was found in the descending colon.                            The polyp was semi-pedunculated. The polyp was                            removed with a cold snare. Resection and retrieval                            were complete. Estimated blood loss was minimal.                           A 2 mm polyp was found in the rectum. The polyp was                            sessile. The polyp was removed with  a cold snare.                            Resection and retrieval were complete. Estimated                            blood loss was minimal.                           Many large-mouthed, medium-mouthed and                            small-mouthed diverticula were found in the sigmoid                            colon, descending colon, transverse colon and                            ascending colon.                           The exam was otherwise normal throughout the                            examined colon.                           The retroflexed view of the distal rectum and anal                            verge was normal and showed no anal or rectal                            abnormalities. Complications:            No immediate complications. Estimated Blood Loss:     Estimated blood loss was minimal. Impression:               - Two 5 to 15 mm polyps in the cecum, removed with                            a cold snare. Resected and retrieved.                           - One 5 mm polyp in the ascending colon, removed                            with a cold snare. Resected and retrieved.                           - Six 3 to 10 mm polyps in the transverse colon,                            removed with a cold snare. Resected and retrieved.                           -  One 12 mm polyp in the descending colon, removed                            with a cold snare. Resected and retrieved.                           - One 2 mm polyp in  the rectum, removed with a cold                            snare. Resected and retrieved.                           - Severe diverticulosis in the sigmoid colon, in                            the descending colon, in the transverse colon and                            in the ascending colon.                           - The distal rectum and anal verge are normal on                            retroflexion view. Recommendation:           - Patient has a contact number available for                            emergencies. The signs and symptoms of potential                            delayed complications were discussed with the                            patient. Return to normal activities tomorrow.                            Written discharge instructions were provided to the                            patient.                           - Resume previous diet.                           - Continue present medications.                           - Await pathology results.                           - Repeat colonoscopy in 1 year for surveillance. [place] Sylvan E. Stacia, MD 08/13/2024 10:29:36 AM This report has been signed electronically.

## 2024-08-14 ENCOUNTER — Telehealth: Payer: Self-pay

## 2024-08-14 NOTE — Telephone Encounter (Signed)
" °  Follow up Call-     08/13/2024    8:55 AM  Call back number  Post procedure Call Back phone  # [phone]  Permission to leave phone message Yes     Patient questions:  Do you have a fever, pain , or abdominal swelling? No. Pain Score  0 *  Have you tolerated food without any problems? Yes.    Have you been able to return to your normal activities? Yes.    Do you have any questions about your discharge instructions: Diet   No. Medications  No. Follow up visit  No.  Do you have questions or concerns about your Care? No.  Actions: * If pain score is 4 or above: No action needed, pain <4.   "

## 2024-08-16 ENCOUNTER — Telehealth: Payer: Self-pay

## 2024-08-16 DIAGNOSIS — E1121 Type 2 diabetes mellitus with diabetic nephropathy: Secondary | ICD-10-CM

## 2024-08-16 DIAGNOSIS — E1129 Type 2 diabetes mellitus with other diabetic kidney complication: Secondary | ICD-10-CM

## 2024-08-16 DIAGNOSIS — E1165 Type 2 diabetes mellitus with hyperglycemia: Secondary | ICD-10-CM

## 2024-08-16 NOTE — Telephone Encounter (Signed)
 Copied from CRM #[id number]. Topic: Clinical - Prescription Issue >> Aug 16, 2024  9:21 AM Charles Payne wrote: Reason for CRM:  He is having issues with his pharmacy getting Semaglutide ,0.25 or 0.5MG /DOS, (OZEMPIC , 0.25 OR 0.5 MG/DOSE,) 2 MG/3ML SOPN. The pharmacy is trying to charge him over $200. Also, his pharmacy said that would not fill empagliflozin  (JARDIANCE ) 10 MG TABS tablet He has Medicare part A and B; Also, has Medicaid.  He wants to know if he can get these medications or not. Please call him at [phone] to discuss what to do. Thanks

## 2024-08-19 MED ORDER — EMPAGLIFLOZIN 10 MG PO TABS: 10.0000 mg | ORAL_TABLET | Freq: Every day | ORAL | 3 refills | Status: AC

## 2024-08-19 NOTE — Addendum Note (Signed)
 Addended by: Karaline Buresh G on: 08/19/2024 06:39 PM   Modules accepted: Orders

## 2024-08-20 ENCOUNTER — Ambulatory Visit (INDEPENDENT_AMBULATORY_CARE_PROVIDER_SITE_OTHER): Payer: Worker's Compensation | Admitting: Plastic Surgery

## 2024-08-20 DIAGNOSIS — L905 Scar conditions and fibrosis of skin: Secondary | ICD-10-CM

## 2024-08-20 DIAGNOSIS — S01511A Laceration without foreign body of lip, initial encounter: Secondary | ICD-10-CM

## 2024-08-20 LAB — SURGICAL PATHOLOGY

## 2024-08-20 NOTE — Progress Notes (Signed)
 Preoperative Dx: upper lip scar  Postoperative Dx:  same  Procedure: laser to upper lip   Anesthesia: none  Description of Procedure:  Risks and complications were explained to the patient. Consent was confirmed and signed. Eye protection was placed. Time out was called and all information was confirmed to be correct. The area  area was prepped with alcohol and wiped dry. The TRL laser was set at 50 microns. The upper lip was lasered after injected it with lidocaine . The patient tolerated the procedure well and there were no complications. The patient is to follow up in 4 weeks.

## 2024-08-22 ENCOUNTER — Ambulatory Visit: Payer: Self-pay | Admitting: Gastroenterology

## 2024-08-22 NOTE — Progress Notes (Signed)
 Charles Payne,   All of the polyps that I removed during your recent procedure were completely benign but were proven to be pre-cancerous polyps that MAY have grown into cancers if they had not been removed.  Studies shows that at least 20% of women over age 66 and 30% of men over age 71 have pre-cancerous polyps.  Based on current nationally recognized surveillance guidelines, I recommend that you have a repeat colonoscopy in 1 year.   If you develop any new rectal bleeding, abdominal pain or significant bowel habit changes, please contact me before then.

## 2024-08-30 ENCOUNTER — Ambulatory Visit: Payer: Worker's Compensation | Admitting: Plastic Surgery

## 2024-09-04 NOTE — Telephone Encounter (Signed)
 Spoke with pt states need PA for both meds sent to PA team

## 2024-09-04 NOTE — Telephone Encounter (Signed)
 Pt called back in to follow-up. He is still needing help on getting his prescription for Jardiance  and Ozempic . He mentioned that pharmacy is not helping him get anywhere and asked for someone to give him a call.

## 2024-09-05 ENCOUNTER — Other Ambulatory Visit (HOSPITAL_COMMUNITY): Payer: Self-pay

## 2024-09-05 ENCOUNTER — Telehealth: Payer: Self-pay

## 2024-09-05 NOTE — Telephone Encounter (Signed)
 Pharmacy Patient Advocate Encounter   Received notification from Physician's Office that prior authorization for Jardiance  10mg  tabs is required/requested.   Insurance verification completed.   The patient is insured through [facility].   Per test claim: The current 30 day co-pay is, $36.23.  No PA needed at this time. This test claim was processed through [facility]- copay amounts may vary at other pharmacies due to pharmacy/plan contracts, or as the patient moves through the different stages of their insurance plan.

## 2024-09-05 NOTE — Telephone Encounter (Signed)
-  Submitted PA also to [facility]. PA pending

## 2024-09-05 NOTE — Telephone Encounter (Signed)
 Pharmacy Patient Advocate Encounter   Received notification from Physician's Office that prior authorization for Ozempic  auto injectors is required/requested.   Insurance verification completed.   The patient is insured through [facility].   Per test claim: The current 30 day co-pay is, $554.13.  No PA needed at this time. This test claim was processed through [facility]- copay amounts may vary at other pharmacies due to pharmacy/plan contracts, or as the patient moves through the different stages of their insurance plan.

## 2024-09-06 NOTE — Telephone Encounter (Signed)
 Copied from CRM [id number]. Topic: Clinical - Medication Prior Auth >> Sep 05, 2024  2:25 PM Macario HERO wrote: Reason for CRM: Patient called regarding prior authorization for Ozempic  and Jardiance . Stated that he no longer has BCBS and only has medicare and Medicaid. Patient is requesting provider nurse follow up with him.  Spoke with pt about jardiance  and ozempic  he got the jardiance  for 4 dollars but not ozempic  yet states might have the wrong insruance sent another message to the PA team to check on and then will call pt back

## 2024-09-08 NOTE — Patient Instructions (Signed)
 SURGICAL WAITING ROOM VISITATION Patients having surgery or a procedure may have no more than 2 support people in the waiting area - these visitors may rotate in the visitor waiting room.   If the patient needs to stay at the hospital during part of their recovery, the visitor guidelines for inpatient rooms apply.  PRE-OP VISITATION  Pre-op nurse will coordinate an appropriate time for 1 support person to accompany the patient in pre-op.  This support person may not rotate.  This visitor will be contacted when the time is appropriate for the visitor to come back in the pre-op area.  Please refer to the [facility] website for the visitor guidelines for Inpatients (after your surgery is over and you are in a regular room).  Temporary Visitor Restrictions  Children ages 64 and under will not be able to visit patients in [facility] under most circumstances. Visitation is not restricted outside of hospitals unless noted otherwise in the [facility] and Location Specific Visitation Guidelines at :      [email or web address] Visitors with respiratory illnesses are discouraged from visiting and should remain at home.  You are not required to quarantine at this time prior to your surgery. However, you must do this: Hand Hygiene often Do NOT share personal items Notify your provider if you are in close contact with someone who has COVID or you develop fever 100.4 or greater, new onset of sneezing, cough, sore throat, shortness of breath or body aches.  If you test positive for Covid or have been in contact with anyone that has tested positive in the last 10 days please notify you surgeon.    Your procedure is scheduled on:  FRIDAY  09-20-2024  Report to [facility] Main Entrance: [address] entrance where the [employer] is available.   Report to admitting at: 05:15 AM  Call this number if you have any questions or problems the morning of surgery [phone]  Do not eat food  after Midnight the night prior to your surgery/procedure.  After Midnight you may have the following liquids until  04:30 AM DAY OF SURGERY  Clear Liquid Diet Water Black Coffee (sugar ok, NO MILK/CREAM OR CREAMERS)  Tea (sugar ok, NO MILK/CREAM OR CREAMERS) regular and decaf                             Plain Jell-O  with no fruit (NO RED)                                           Fruit ices (not with fruit pulp, NO RED)                                     Popsicles (NO RED)                                                                  Juice: NO CITRUS JUICES: only apple, WHITE grape, WHITE cranberry Sports drinks like Gatorade or Powerade (NO RED)  Nothing else by mouth after 04:30 am : No Water, candy, chewing gum or throat lozenges etc.     FOLLOW ANY ADDITIONAL PRE OP INSTRUCTIONS YOU RECEIVED FROM YOUR SURGEON'S OFFICE!!!   Oral Hygiene is also important to reduce your risk of infection.        Remember - BRUSH YOUR TEETH THE MORNING OF SURGERY WITH YOUR REGULAR TOOTHPASTE  Do NOT smoke after Midnight the night before surgery.  ASPIRIN- Stop taking 5-7 days before your surgery.   STOP TAKING all Vitamins, Herbs and supplements 1 week before your surgery.   Take ONLY these medicines the morning of surgery with A SIP OF WATER: Propranolol  (INDERAL ), and you may use your Eye drops,  DO NOT TAKE Lisinopril  (ZESTRIL ) the morning of your surgery.   EMPAGLIFLOZIN  - on hold d/t insurance SEMAGLUTIDE - on hold METFORMIN- DO NOT TAKE the morning of your surgery.  GLIPIZIDE  - DO NOT TAKE the morning of your surgery.   If You have been diagnosed with Sleep Apnea - Bring CPAP mask and tubing day of surgery. We will provide you with a CPAP machine on the day of your surgery.                   You may not have any metal on your body including  jewelry, and body piercing  Do not wear lotions, powders, cologne, or deodorant  Men may shave face and neck.  Contacts,  Hearing Aids, dentures or bridgework may not be worn into surgery. DENTURES WILL BE REMOVED PRIOR TO SURGERY PLEASE DO NOT APPLY Poly grip OR ADHESIVES!!!  You may bring a small overnight bag with you on the day of surgery, only pack items that are not valuable. [facility] IS NOT RESPONSIBLE   FOR VALUABLES THAT ARE LOST OR STOLEN.   Patients discharged on the day of surgery will not be allowed to drive home.  Someone NEEDS to stay with you for the first 24 hours after anesthesia.  Do not bring your home medications to the hospital. The Pharmacy will dispense medications listed on your medication list to you during your admission in the Hospital.  Special Instructions: Bring a copy of your healthcare power of attorney and living will documents the day of surgery, if you wish to have them scanned into your [facility] Records- EPIC  Please read over the following fact sheets you were given: IF YOU HAVE QUESTIONS ABOUT YOUR PRE-OP INSTRUCTIONS, PLEASE CALL [phone].      Pre-operative 4 CHG Bath Instructions   You can play a key role in reducing the risk of infection after surgery. Your skin needs to be as free of germs as possible. You can reduce the number of germs on your skin by washing with CHG (chlorhexidine gluconate) soap before surgery. CHG is an antiseptic soap that kills germs and continues to kill germs even after washing.   DO NOT use if you have an allergy to chlorhexidine/CHG or antibacterial soaps. If your skin becomes reddened or irritated, stop using the CHG and notify one of our RNs at [phone]  Please shower with the CHG soap starting 4 days before surgery using the following schedule: [facility]  09-16-2024      Do NOT use CHG soap  the morning of your                                                                                                                                  surgery.         Please keep in mind the following:  DO NOT shave, including legs and underarms, starting the day of your first shower.   You may shave your face at any point before/day of surgery.  Place clean sheets on your bed the day you start using CHG soap. Use a clean washcloth (not used since being washed) for each shower. DO NOT sleep with pets once you start using the CHG.  CHG Shower Instructions:  If you choose to wash your hair and private area, wash first with your normal shampoo/soap.  After you use shampoo/soap, rinse your hair and body thoroughly to remove shampoo/soap residue.  Turn the water OFF and apply about 3 tablespoons (45 ml) of CHG soap to a CLEAN washcloth.  Apply CHG soap ONLY FROM YOUR NECK DOWN TO YOUR TOES (washing for 3-5 minutes)  DO NOT use CHG soap on face, private areas, open wounds, or sores.  Pay special attention to the area where your surgery is being performed.  If you are having back surgery, having someone wash your back for you may be helpful. Wait 2 minutes after CHG soap is applied, then you may rinse off the CHG soap.  Pat dry with a clean towel  Put on clean clothes/pajamas   If you choose to wear lotion, please use ONLY the CHG-compatible lotions on the back of this paper.     Additional instructions for the day of surgery: DO NOT APPLY any CHG Soap,  lotions, deodorants, cologne, or perfumes on the day of surgery  Put on clean/comfortable clothes.  Brush your teeth.  Ask your nurse before applying any prescription medications to the skin.   CHG Compatible Lotions   Aveeno Moisturizing lotion  Cetaphil Moisturizing Cream  Cetaphil Moisturizing Lotion  Clairol Herbal Essence Moisturizing Lotion, Dry Skin  Clairol Herbal Essence Moisturizing Lotion, Extra Dry Skin  Clairol Herbal Essence Moisturizing Lotion, Normal Skin  Curel Age Defying Therapeutic Moisturizing Lotion with Alpha Hydroxy  Curel  Extreme Care Body Lotion  Curel Soothing Hands Moisturizing Hand Lotion  Curel Therapeutic Moisturizing Cream, Fragrance-Free  Curel Therapeutic Moisturizing Lotion, Fragrance-Free  Curel Therapeutic Moisturizing Lotion, Original Formula  Eucerin Daily Replenishing Lotion  Eucerin Dry Skin Therapy Plus Alpha Hydroxy Crme  Eucerin Dry Skin Therapy Plus Alpha Hydroxy Lotion  Eucerin Original Crme  Eucerin Original Lotion  Eucerin Plus Crme Eucerin Plus Lotion  Eucerin TriLipid Replenishing Lotion  Keri Anti-Bacterial Hand Lotion  Keri Deep Conditioning Original Lotion Dry Skin Formula Softly Scented  Keri Deep Conditioning Original Lotion, Fragrance Free Sensitive Skin Formula  Keri Lotion Fast Absorbing Fragrance Free Sensitive Skin Formula  Keri Lotion Fast Absorbing Softly Scented Dry Skin  Formula  Keri Original Lotion  Keri Skin Renewal Lotion Keri Silky Smooth Lotion  Keri Silky Smooth Sensitive Skin Lotion  Nivea Body Creamy Conditioning Oil  Nivea Body Extra Enriched Lotion  Nivea Body Original Lotion  Nivea Body Sheer Moisturizing Lotion Nivea Crme  Nivea Skin Firming Lotion  NutraDerm 30 Skin Lotion  NutraDerm Skin Lotion  NutraDerm Therapeutic Skin Cream  NutraDerm Therapeutic Skin Lotion  ProShield Protective Hand Cream  Provon moisturizing lotion   FAILURE TO FOLLOW THESE INSTRUCTIONS MAY RESULT IN THE CANCELLATION OF YOUR SURGERY  PATIENT SIGNATURE_________________________________  NURSE SIGNATURE__________________________________  ________________________________________________________________________       Nasario Exon    An incentive spirometer is a tool that can help keep your lungs clear and active. This tool measures how well you are filling your lungs with each breath. Taking long deep breaths may help reverse or decrease the chance of developing breathing (pulmonary) problems (especially infection) following: A long period of time when  you are unable to move or be active. BEFORE THE PROCEDURE  If the spirometer includes an indicator to show your best effort, your nurse or respiratory therapist will set it to a desired goal. If possible, sit up straight or lean slightly forward. Try not to slouch. Hold the incentive spirometer in an upright position. INSTRUCTIONS FOR USE  Sit on the edge of your bed if possible, or sit up as far as you can in bed or on a chair. Hold the incentive spirometer in an upright position. Breathe out normally. Place the mouthpiece in your mouth and seal your lips tightly around it. Breathe in slowly and as deeply as possible, raising the piston or the ball toward the top of the column. Hold your breath for 3-5 seconds or for as long as possible. Allow the piston or ball to fall to the bottom of the column. Remove the mouthpiece from your mouth and breathe out normally. Rest for a few seconds and repeat Steps 1 through 7 at least 10 times every 1-2 hours when you are awake. Take your time and take a few normal breaths between deep breaths. The spirometer may include an indicator to show your best effort. Use the indicator as a goal to work toward during each repetition. After each set of 10 deep breaths, practice coughing to be sure your lungs are clear. If you have an incision (the cut made at the time of surgery), support your incision when coughing by placing a pillow or rolled up towels firmly against it. Once you are able to get out of bed, walk around indoors and cough well. You may stop using the incentive spirometer when instructed by your caregiver.  RISKS AND COMPLICATIONS Take your time so you do not get dizzy or light-headed. If you are in pain, you may need to take or ask for pain medication before doing incentive spirometry. It is harder to take a deep breath if you are having pain. AFTER USE Rest and breathe slowly and easily. It can be helpful to keep track of a log of your progress.  Your caregiver can provide you with a simple table to help with this. If you are using the spirometer at home, follow these instructions: SEEK MEDICAL CARE IF:  You are having difficultly using the spirometer. You have trouble using the spirometer as often as instructed. Your pain medication is not giving enough relief while using the spirometer. You develop fever of 100.5 F (38.1 C) or higher.  SEEK IMMEDIATE MEDICAL CARE IF:  You cough up bloody sputum that had not been present before. You develop fever of 102 F (38.9 C) or greater. You develop worsening pain at or near the incision site. MAKE SURE YOU:  Understand these instructions. Will watch your condition. Will get help right away if you are not doing well or get worse. Document Released: 12/19/2006 Document Revised: 10/31/2011 Document Reviewed: 02/19/2007 [facility] Patient Information 2014 [place], [place].      If you would like to see a video about joint replacement:   [email or web address]

## 2024-09-08 NOTE — Progress Notes (Signed)
 COVID Vaccine received:  []  No [x]  Yes Date of any COVID positive Test in last 37 days:None  PCP - Bernardino Cone, MD clearance scanned to [facility] 06-13-24 Cardiologist - None  Chest x-ray - 07-31-2020 2v in [facility] EKG - 07-11-2024   new incomplete RBBB? Stress Test -  ECHO -  Cardiac Cath -  CT Coronary Calcium  score:   Pacemaker / ICD device [x]  No []  Yes   Spinal Cord Stimulator:[x]  No []  Yes       History of Sleep Apnea? []  No [x]  Yes   CPAP used?- [x]  No []  Yes    Medication on DOS: Propranolol  (INDERAL ), Eye drops,  Hold DOS: Lisinopril  (ZESTRIL )  Patient has: []  NO Hx DM   []  Pre-DM   []  DM1  [x]   DM2 Does the patient monitor blood sugar?   []  N/A   [x]  No []  Yes  Last A1c was: 7.3  on  04-09-2024   Repeated  7.1 at PST   EMPAGLIFLOZIN  - hold x 7hrs last dose: 09-16-2024 SEMAGLUTIDE - 09-07-24 last injection.  METFORMIN- Hold DOS GLIPIZIDE  - Hold DOS.   Blood Thinner / Instructions: Aspirin Instructions: ASA 81 mg   Hold x 7 day  Activity level: Able to walk up 2 flights of stairs without becoming significantly short of breath or having chest pain?   [x]    Yes   []  No,  would have:  Patient can perform ADLs without assistance.  [x]   Yes    []  No   Anesthesia review: DM2, HTN, essential tremor, anemia, some HOH- no HAs, OSA-no CPAP,  Hx unprovoked PE 2016, some DOE,   Patient denies any S&S of respiratory illness or Covid - no shortness of breath, fever, cough or chest pain at PAT appointment.  Patient verbalized understanding and agreement to the Pre-Surgical Instructions that were given to them at this PAT appointment. Patient was also educated of the need to review these PAT instructions again prior to his surgery.I reviewed the appropriate phone numbers to call if they have any and questions or concerns.

## 2024-09-10 ENCOUNTER — Encounter (HOSPITAL_COMMUNITY): Payer: Self-pay

## 2024-09-10 ENCOUNTER — Other Ambulatory Visit (HOSPITAL_COMMUNITY): Payer: Self-pay

## 2024-09-10 ENCOUNTER — Other Ambulatory Visit: Payer: Self-pay

## 2024-09-10 ENCOUNTER — Encounter (HOSPITAL_COMMUNITY)
Admission: RE | Admit: 2024-09-10 | Discharge: 2024-09-10 | Disposition: A | Discharge status: Home / Self Care | Source: Ambulatory Visit | Attending: Orthopaedic Surgery | Admitting: Orthopaedic Surgery

## 2024-09-10 VITALS — BP 136/64 | HR 70 | Temp 98.4°F | Resp 20 | Ht 73.0 in | Wt 240.0 lb

## 2024-09-10 DIAGNOSIS — E119 Type 2 diabetes mellitus without complications: Secondary | ICD-10-CM | POA: Insufficient documentation

## 2024-09-10 DIAGNOSIS — Z01812 Encounter for preprocedural laboratory examination: Secondary | ICD-10-CM | POA: Diagnosis present

## 2024-09-10 DIAGNOSIS — M1711 Unilateral primary osteoarthritis, right knee: Secondary | ICD-10-CM | POA: Diagnosis not present

## 2024-09-10 DIAGNOSIS — Z01818 Encounter for other preprocedural examination: Secondary | ICD-10-CM

## 2024-09-10 HISTORY — DX: Essential (primary) hypertension: I10

## 2024-09-10 HISTORY — DX: Pneumonia, unspecified organism: J18.9

## 2024-09-10 HISTORY — DX: Anemia, unspecified: D64.9

## 2024-09-10 HISTORY — DX: Sleep apnea, unspecified: G47.30

## 2024-09-10 HISTORY — DX: Gastro-esophageal reflux disease without esophagitis: K21.9

## 2024-09-10 LAB — GLUCOSE, CAPILLARY: Glucose-Capillary: 231 mg/dL — ABNORMAL HIGH (ref 70–99)

## 2024-09-10 LAB — BASIC METABOLIC PANEL WITH GFR
Anion gap: 13 (ref 5–15)
BUN: 15 mg/dL (ref 8–23)
CO2: 21 mmol/L — ABNORMAL LOW (ref 22–32)
Calcium: 9.4 mg/dL (ref 8.9–10.3)
Chloride: 106 mmol/L (ref 98–111)
Creatinine, Ser: 1.14 mg/dL (ref 0.61–1.24)
GFR, Estimated: >60 mL/min
Glucose, Bld: 238 mg/dL — ABNORMAL HIGH (ref 70–99)
Potassium: 4.2 mmol/L (ref 3.5–5.1)
Sodium: 140 mmol/L (ref 135–145)

## 2024-09-10 LAB — HEMOGLOBIN A1C
Hgb A1c MFr Bld: 7.1 % — ABNORMAL HIGH (ref 4.8–5.6)
Mean Plasma Glucose: 157.07 mg/dL

## 2024-09-10 LAB — SURGICAL PCR SCREEN
MRSA, PCR: NEGATIVE
Staphylococcus aureus: NEGATIVE

## 2024-09-10 LAB — CBC
HCT: 45.5 % (ref 39.0–52.0)
Hemoglobin: 14 g/dL (ref 13.0–17.0)
MCH: 25.7 pg — ABNORMAL LOW (ref 26.0–34.0)
MCHC: 30.8 g/dL (ref 30.0–36.0)
MCV: 83.6 fL (ref 80.0–100.0)
Platelets: 233 K/uL (ref 150–400)
RBC: 5.44 MIL/uL (ref 4.22–5.81)
RDW: 15.5 % (ref 11.5–15.5)
WBC: 9.2 K/uL (ref 4.0–10.5)
nRBC: 0 % (ref 0.0–0.2)

## 2024-09-11 ENCOUNTER — Other Ambulatory Visit (HOSPITAL_COMMUNITY): Payer: Self-pay

## 2024-09-12 ENCOUNTER — Encounter: Payer: Self-pay | Admitting: Dermatology

## 2024-09-12 ENCOUNTER — Other Ambulatory Visit: Payer: Self-pay | Admitting: Physician Assistant

## 2024-09-12 ENCOUNTER — Ambulatory Visit: Admitting: Dermatology

## 2024-09-12 VITALS — BP 118/84 | HR 65

## 2024-09-12 DIAGNOSIS — B079 Viral wart, unspecified: Secondary | ICD-10-CM | POA: Diagnosis not present

## 2024-09-12 DIAGNOSIS — W908XXA Exposure to other nonionizing radiation, initial encounter: Secondary | ICD-10-CM | POA: Diagnosis not present

## 2024-09-12 DIAGNOSIS — D229 Melanocytic nevi, unspecified: Secondary | ICD-10-CM

## 2024-09-12 DIAGNOSIS — L821 Other seborrheic keratosis: Secondary | ICD-10-CM

## 2024-09-12 DIAGNOSIS — Z1283 Encounter for screening for malignant neoplasm of skin: Secondary | ICD-10-CM | POA: Diagnosis not present

## 2024-09-12 DIAGNOSIS — L814 Other melanin hyperpigmentation: Secondary | ICD-10-CM

## 2024-09-12 DIAGNOSIS — L578 Other skin changes due to chronic exposure to nonionizing radiation: Secondary | ICD-10-CM | POA: Diagnosis not present

## 2024-09-12 DIAGNOSIS — L304 Erythema intertrigo: Secondary | ICD-10-CM | POA: Diagnosis not present

## 2024-09-12 DIAGNOSIS — D1801 Hemangioma of skin and subcutaneous tissue: Secondary | ICD-10-CM | POA: Diagnosis not present

## 2024-09-12 MED ORDER — DOXYCYCLINE HYCLATE 100 MG PO TABS: 100.0000 mg | ORAL_TABLET | Freq: Two times a day (BID) | ORAL | 0 refills | Status: DC

## 2024-09-12 MED ORDER — DOCUSATE SODIUM 100 MG PO CAPS: 100.0000 mg | ORAL_CAPSULE | Freq: Every day | ORAL | 2 refills | Status: AC | PRN

## 2024-09-12 MED ORDER — FLUOCINONIDE 0.05 % EX CREA: 1.0000 | TOPICAL_CREAM | Freq: Two times a day (BID) | CUTANEOUS | 1 refills | Status: AC

## 2024-09-12 MED ORDER — ONDANSETRON HCL 4 MG PO TABS: 4.0000 mg | ORAL_TABLET | Freq: Three times a day (TID) | ORAL | 0 refills | Status: DC | PRN

## 2024-09-12 MED ORDER — METHOCARBAMOL 750 MG PO TABS: 750.0000 mg | ORAL_TABLET | Freq: Three times a day (TID) | ORAL | 2 refills | Status: DC | PRN

## 2024-09-12 MED ORDER — APIXABAN 2.5 MG PO TABS: ORAL_TABLET | ORAL | 0 refills | Status: DC

## 2024-09-12 MED ORDER — NYSTATIN 100000 UNIT/GM EX POWD: 1.0000 | Freq: Three times a day (TID) | CUTANEOUS | 1 refills | Status: AC

## 2024-09-12 MED ORDER — OXYCODONE-ACETAMINOPHEN 5-325 MG PO TABS: 1.0000 | ORAL_TABLET | Freq: Four times a day (QID) | ORAL | 0 refills | Status: DC | PRN

## 2024-09-12 NOTE — Progress Notes (Signed)
 "  Total Body Skin Exam (TBSE) Visit   History of Present Illness Charles Payne is a 64 year old male who presents for a full body skin check.  He has concerns about discoloration and worsening condition of his big toenail despite using over-the-counter antifungal treatments. The condition has not caused him pain.  He reports a rash at the base of his penis, present for a few weeks. Initially, he used a ten-year-old prescription of betamethasone, which alleviated the itching but did not clear the rash. The rash is itchy and sore, affecting both sides, with more severity on one side. He has been using Sarna, which treated the itch but worsened the rash.  He mentions a growth at the base of his penis, noticed about two months ago, which he picked off but it grew back. He feels embarrassed about this condition.  He recalls having a sore spot on his ear that took a long time to heal after a previous procedure, but it has since improved. He has not had significant sun exposure since before Thanksgiving, when he had multiple spots on his face.  He mentions a previous concern about a spot that was identified as an angioma, which he initially thought might be a tick or a blood vessel issue.  He is not a heavy drinker and regularly plays golf.  Patient presents today for follow up visit for TBSE. Patient was last evaluated on 07/15/2024 . Patient reports medication changes. Patient reports he  does have spots, moles and lesions of concern to be evaluated. Patient reports throughout his lifetime he  has had moderate sun exposure. Currently, patient reports if he  has excessive sun exposure, he  does apply sunscreen and/or wears protective coverings. Patient reports he  does not have hx of bx. Patient admits to  family history of skin cancers.   The following portions of the chart were reviewed this encounter and updated as appropriate: medications, allergies, medical history  Review of Systems:  No  other skin or systemic complaints except as noted in HPI or Assessment and Plan.  Objective  Well appearing patient in no apparent distress; mood and affect are within normal limits.  A full examination was performed including scalp, head, eyes, ears, nose, lips, neck, chest, axillae, abdomen, back, buttocks, bilateral upper extremities, bilateral lower extremities, hands, feet, fingers, toes, fingernails, and toenails. All findings within normal limits unless otherwise noted below.   Relevant physical exam findings are noted in the Assessment and Plan. Mons Pubis Verrucous papule   Assessment & Plan   LENTIGINES, SEBORRHEIC KERATOSES, HEMANGIOMAS - Benign normal skin lesions - Benign-appearing - Call for any changes  MELANOCYTIC NEVI - Tan-brown and/or pink-flesh-colored symmetric macules and papules - Benign appearing on exam today - Observation - Call clinic for new or changing moles - Recommend daily use of broad spectrum spf 30+ sunscreen to sun-exposed areas.   ACTINIC DAMAGE - Chronic condition, secondary to cumulative UV/sun exposure - diffuse scaly erythematous macules with underlying dyspigmentation - Recommend daily broad spectrum sunscreen SPF 30+ to sun-exposed areas, reapply every 2 hours as needed.  - Staying in the shade or wearing long sleeves, sun glasses (UVA+UVB protection) and wide brim hats (4-inch brim around the entire circumference of the hat) are also recommended for sun protection.  - Call for new or changing lesions.  INTERTRIGO Exam: Erythematous macerated patches in body folds in groin  Flared  Intertrigo is a chronic recurrent rash that occurs in skin fold areas  that may be associated with friction; heat; moisture; yeast; fungus; and bacteria.  It is exacerbated by increased movement / activity; sweating; and higher atmospheric temperature.  Use of an absorbant powder such as Zeasorb AF powder or other OTC antifungal powder to the area daily can  prevent rash recurrence. Other options to help keep the area dry include blow drying the area after bathing or using antiperspirant products such as Duradry sweat minimizing gel.  Treatment Plan: Fluocinonide  Cream 0.05%  Nystatin  Powder   SKIN CANCER SCREENING PERFORMED TODAY. LENTIGINES   SEBORRHEIC KERATOSIS   CHERRY ANGIOMA   MULTIPLE BENIGN NEVI   ACTINIC SKIN DAMAGE   ERYTHEMA INTERTRIGO   VIRAL WARTS, UNSPECIFIED TYPE Mons Pubis - Destruction of lesion - Mons Pubis Complexity: simple   Destruction method: cryotherapy   Informed consent: discussed and consent obtained   Timeout:  patient name, date of birth, surgical site, and procedure verified Lesion destroyed using liquid nitrogen: Yes   Region frozen until ice ball extended beyond lesion: Yes   Outcome: patient tolerated procedure well with no complications   Post-procedure details: wound care instructions given    Return in about 10 weeks (around 11/21/2024) for F/u Intertrigo with Erminio .  LILLETTE Virgle Boards, Mohs/Dermatology Tech am acting as a neurosurgeon for RUFUS CHRISTELLA HOLY, MD.   Documentation: I have reviewed the above documentation for accuracy and completeness, and I agree with the above.  RUFUS CHRISTELLA HOLY, MD   "

## 2024-09-12 NOTE — Patient Instructions (Signed)

## 2024-09-16 ENCOUNTER — Ambulatory Visit: Admitting: Dermatology

## 2024-09-16 ENCOUNTER — Other Ambulatory Visit (HOSPITAL_COMMUNITY): Payer: Self-pay

## 2024-09-18 ENCOUNTER — Other Ambulatory Visit (HOSPITAL_COMMUNITY): Payer: Self-pay

## 2024-09-19 ENCOUNTER — Encounter (HOSPITAL_COMMUNITY): Payer: Self-pay | Admitting: Orthopaedic Surgery

## 2024-09-19 NOTE — Anesthesia Preprocedure Evaluation (Addendum)
 "                                  Anesthesia Evaluation  Patient identified by MRN, date of birth, ID band Patient awake    Reviewed: Allergy & Precautions, NPO status , Patient's Chart, lab work & pertinent test results  Airway Mallampati: III  TM Distance: >3 FB     Dental  (+) Teeth Intact, Caps, Dental Advisory Given   Pulmonary sleep apnea , pneumonia, resolved, former smoker, PE Hx PE 2016, some DOE   Pulmonary exam normal breath sounds clear to auscultation       Cardiovascular hypertension, Pt. on medications and Pt. on home beta blockers + DOE  Normal cardiovascular exam+ dysrhythmias Atrial Fibrillation  Rhythm:Regular Rate:Normal  EKG 07/11/24 NSR, incomplete RBBB pattern   Neuro/Psych  Headaches Essential tremor Bilateral sensorineural hearing loss  negative psych ROS   GI/Hepatic Neg liver ROS,GERD  Medicated,,Hx/o diverticulosis   Endo/Other  diabetes, Poorly Controlled, Type 2, Oral Hypoglycemic Agents  GLP-1 RA therapy- last dose 1/17 HLD  Renal/GU negative Renal ROSLab Results      Component                Value               Date                      NA                       140                 09/10/2024                CL                       106                 09/10/2024                K                        4.2                 09/10/2024                CO2                      21 (L)              09/10/2024                BUN                      15                  09/10/2024                CREATININE               1.14                09/10/2024                GFRNONAA                 >  60                 09/10/2024                CALCIUM                   9.4                 09/10/2024                ALBUMIN                  4.5                 07/11/2024                GLUCOSE                  238 (H)             09/10/2024             negative genitourinary   Musculoskeletal  (+) Arthritis , Osteoarthritis,  OA right  knee DDD lumbar spiine Lumbar spinal stenosis   Abdominal  (+) + obese  Peds  Hematology  (+) Blood dyscrasia, anemia Eliquis  therapy- last dose Patient states he's not taking  Lab Results      Component                Value               Date                      WBC                      9.2                 09/10/2024                HGB                      14.0                09/10/2024                HCT                      45.5                09/10/2024                MCV                      83.6                09/10/2024                PLT                      233                 09/10/2024              Anesthesia Other Findings   Reproductive/Obstetrics                              Anesthesia Physical Anesthesia Plan  ASA: 3  Anesthesia Plan: Spinal   Post-op Pain Management: Regional block*, Minimal or no pain anticipated, Dilaudid  IV and Tylenol  PO (pre-op)*   Induction: Intravenous  PONV Risk Score and Plan: 2 and Treatment may vary due to age or medical condition and Propofol  infusion  Airway Management Planned: Natural Airway and Simple Face Mask  Additional Equipment: None  Intra-op Plan:   Post-operative Plan:   Informed Consent: I have reviewed the patients History and Physical, chart, labs and discussed the procedure including the risks, benefits and alternatives for the proposed anesthesia with the patient or authorized representative who has indicated his/her understanding and acceptance.     Dental advisory given  Plan Discussed with: Anesthesiologist and CRNA  Anesthesia Plan Comments:          Anesthesia Quick Evaluation  "

## 2024-09-20 ENCOUNTER — Encounter (HOSPITAL_COMMUNITY): Payer: Self-pay | Admitting: Orthopaedic Surgery

## 2024-09-20 ENCOUNTER — Observation Stay (HOSPITAL_COMMUNITY)

## 2024-09-20 ENCOUNTER — Other Ambulatory Visit: Payer: Self-pay

## 2024-09-20 ENCOUNTER — Ambulatory Visit (HOSPITAL_COMMUNITY): Payer: Self-pay | Admitting: Anesthesiology

## 2024-09-20 ENCOUNTER — Encounter (HOSPITAL_COMMUNITY): Admission: RE | Disposition: A | Payer: Self-pay | Source: Ambulatory Visit | Attending: Orthopaedic Surgery

## 2024-09-20 ENCOUNTER — Observation Stay (HOSPITAL_COMMUNITY)
Admission: RE | Admit: 2024-09-20 | Discharge: 2024-09-21 | Disposition: A | Source: Ambulatory Visit | Attending: Orthopaedic Surgery | Admitting: Orthopaedic Surgery

## 2024-09-20 DIAGNOSIS — Z7982 Long term (current) use of aspirin: Secondary | ICD-10-CM | POA: Insufficient documentation

## 2024-09-20 DIAGNOSIS — E119 Type 2 diabetes mellitus without complications: Secondary | ICD-10-CM | POA: Insufficient documentation

## 2024-09-20 DIAGNOSIS — M21261 Flexion deformity, right knee: Secondary | ICD-10-CM | POA: Insufficient documentation

## 2024-09-20 DIAGNOSIS — Z87891 Personal history of nicotine dependence: Secondary | ICD-10-CM

## 2024-09-20 DIAGNOSIS — I10 Essential (primary) hypertension: Secondary | ICD-10-CM | POA: Diagnosis not present

## 2024-09-20 DIAGNOSIS — Z01818 Encounter for other preprocedural examination: Secondary | ICD-10-CM

## 2024-09-20 DIAGNOSIS — Z7984 Long term (current) use of oral hypoglycemic drugs: Secondary | ICD-10-CM | POA: Insufficient documentation

## 2024-09-20 DIAGNOSIS — M21061 Valgus deformity, not elsewhere classified, right knee: Secondary | ICD-10-CM | POA: Insufficient documentation

## 2024-09-20 DIAGNOSIS — M1711 Unilateral primary osteoarthritis, right knee: Secondary | ICD-10-CM | POA: Diagnosis not present

## 2024-09-20 DIAGNOSIS — I4891 Unspecified atrial fibrillation: Secondary | ICD-10-CM

## 2024-09-20 DIAGNOSIS — Z96651 Presence of right artificial knee joint: Secondary | ICD-10-CM

## 2024-09-20 DIAGNOSIS — M2419 Other articular cartilage disorders, other specified site: Secondary | ICD-10-CM | POA: Insufficient documentation

## 2024-09-20 DIAGNOSIS — Z79899 Other long term (current) drug therapy: Secondary | ICD-10-CM | POA: Insufficient documentation

## 2024-09-20 DIAGNOSIS — Z96642 Presence of left artificial hip joint: Secondary | ICD-10-CM | POA: Insufficient documentation

## 2024-09-20 LAB — GLUCOSE, CAPILLARY
Glucose-Capillary: 158 mg/dL — ABNORMAL HIGH (ref 70–99)
Glucose-Capillary: 128 mg/dL — ABNORMAL HIGH (ref 70–99)
Glucose-Capillary: 233 mg/dL — ABNORMAL HIGH (ref 70–99)
Glucose-Capillary: 153 mg/dL — ABNORMAL HIGH (ref 70–99)

## 2024-09-20 MED ORDER — ACETAMINOPHEN 500 MG PO TABS
1000.0000 mg | ORAL_TABLET | Freq: Four times a day (QID) | ORAL | Status: AC
  Administered 2024-09-20 – 2024-09-21 (×4): 1000 mg via ORAL
  Filled 2024-09-20 (×4): qty 2

## 2024-09-20 MED ORDER — OXYCODONE HCL 5 MG PO TABS
10.0000 mg | ORAL_TABLET | Freq: Four times a day (QID) | ORAL | Status: DC | PRN
  Administered 2024-09-20 – 2024-09-21 (×3): 10 mg via ORAL
  Filled 2024-09-20 (×3): qty 2

## 2024-09-20 MED ORDER — LACTATED RINGERS IV SOLN: INTRAVENOUS | Status: DC

## 2024-09-20 MED ORDER — SENNA 8.6 MG PO TABS
1.0000 | ORAL_TABLET | Freq: Every day | ORAL | Status: DC
  Administered 2024-09-20 – 2024-09-21 (×2): 8.6 mg via ORAL
  Filled 2024-09-20 (×2): qty 1

## 2024-09-20 MED ORDER — VANCOMYCIN HCL 1000 MG IV SOLR
INTRAVENOUS | Status: AC
  Filled 2024-09-20: qty 20

## 2024-09-20 MED ORDER — HYDROMORPHONE HCL 1 MG/ML IJ SOLN
1.0000 mg | Freq: Three times a day (TID) | INTRAMUSCULAR | Status: DC | PRN
  Administered 2024-09-20 – 2024-09-21 (×3): 1 mg via INTRAVENOUS
  Filled 2024-09-20 (×3): qty 1

## 2024-09-20 MED ORDER — DOXYCYCLINE HYCLATE 100 MG PO TABS
100.0000 mg | ORAL_TABLET | Freq: Two times a day (BID) | ORAL | Status: DC
  Administered 2024-09-21: 100 mg via ORAL
  Filled 2024-09-20: qty 1

## 2024-09-20 MED ORDER — ORAL CARE MOUTH RINSE: 15.0000 mL | Freq: Once | OROMUCOSAL | Status: AC

## 2024-09-20 MED ORDER — CEFAZOLIN SODIUM-DEXTROSE 2-4 GM/100ML-% IV SOLN
2.0000 g | INTRAVENOUS | Status: AC
  Administered 2024-09-20: 2 g via INTRAVENOUS
  Filled 2024-09-20: qty 100

## 2024-09-20 MED ORDER — METHOCARBAMOL 500 MG PO TABS
ORAL_TABLET | ORAL | Status: AC
  Filled 2024-09-20: qty 1

## 2024-09-20 MED ORDER — OXYCODONE HCL 5 MG/5ML PO SOLN: 5.0000 mg | Freq: Once | ORAL | Status: AC | PRN

## 2024-09-20 MED ORDER — TRANEXAMIC ACID 1000 MG/10ML IV SOLN
2000.0000 mg | INTRAVENOUS | Status: DC
  Filled 2024-09-20: qty 20

## 2024-09-20 MED ORDER — PHENYLEPHRINE HCL-NACL 20-0.9 MG/250ML-% IV SOLN
INTRAVENOUS | Status: DC | PRN
  Administered 2024-09-20: 25 ug/min via INTRAVENOUS

## 2024-09-20 MED ORDER — ONDANSETRON HCL 4 MG/2ML IJ SOLN
INTRAMUSCULAR | Status: DC | PRN
  Administered 2024-09-20: 4 mg via INTRAVENOUS

## 2024-09-20 MED ORDER — VANCOMYCIN HCL 1 G IV SOLR
INTRAVENOUS | Status: DC | PRN
  Administered 2024-09-20: 1000 mg via TOPICAL

## 2024-09-20 MED ORDER — LISINOPRIL 5 MG PO TABS
5.0000 mg | ORAL_TABLET | Freq: Every morning | ORAL | Status: DC
  Administered 2024-09-21: 5 mg via ORAL
  Filled 2024-09-20: qty 1

## 2024-09-20 MED ORDER — PROPOFOL 500 MG/50ML IV EMUL
INTRAVENOUS | Status: DC | PRN
  Administered 2024-09-20: 125 ug/kg/min via INTRAVENOUS

## 2024-09-20 MED ORDER — CEFAZOLIN SODIUM-DEXTROSE 2-4 GM/100ML-% IV SOLN
2.0000 g | Freq: Four times a day (QID) | INTRAVENOUS | Status: AC
  Administered 2024-09-20 (×2): 2 g via INTRAVENOUS
  Filled 2024-09-20 (×2): qty 100

## 2024-09-20 MED ORDER — OXYCODONE HCL 5 MG PO TABS
5.0000 mg | ORAL_TABLET | Freq: Once | ORAL | Status: AC | PRN
  Administered 2024-09-20: 5 mg via ORAL

## 2024-09-20 MED ORDER — CHLORHEXIDINE GLUCONATE 0.12 % MT SOLN
15.0000 mL | Freq: Once | OROMUCOSAL | Status: AC
  Administered 2024-09-20: 15 mL via OROMUCOSAL

## 2024-09-20 MED ORDER — ISOPROPYL ALCOHOL 70 % SOLN
Status: DC | PRN
  Administered 2024-09-20: 1 via TOPICAL

## 2024-09-20 MED ORDER — INSULIN ASPART 100 UNIT/ML IJ SOLN
0.0000 [IU] | Freq: Three times a day (TID) | INTRAMUSCULAR | Status: DC
  Administered 2024-09-20: 7 [IU] via SUBCUTANEOUS
  Filled 2024-09-20: qty 7
  Filled 2024-09-20: qty 4

## 2024-09-20 MED ORDER — DOXYCYCLINE HYCLATE 100 MG PO TABS: 100.0000 mg | ORAL_TABLET | Freq: Two times a day (BID) | ORAL | 0 refills | Status: AC

## 2024-09-20 MED ORDER — METOCLOPRAMIDE HCL 5 MG/ML IJ SOLN: 5.0000 mg | Freq: Three times a day (TID) | INTRAMUSCULAR | Status: DC | PRN

## 2024-09-20 MED ORDER — MENTHOL 3 MG MT LOZG: 1.0000 | LOZENGE | OROMUCOSAL | Status: DC | PRN

## 2024-09-20 MED ORDER — HYDROMORPHONE HCL 1 MG/ML IJ SOLN
0.2500 mg | INTRAMUSCULAR | Status: DC | PRN
  Administered 2024-09-20 (×2): 0.5 mg via INTRAVENOUS

## 2024-09-20 MED ORDER — ACETAMINOPHEN 325 MG PO TABS: 325.0000 mg | ORAL_TABLET | Freq: Four times a day (QID) | ORAL | Status: DC | PRN

## 2024-09-20 MED ORDER — OXYCODONE HCL 5 MG PO TABS: 5.0000 mg | ORAL_TABLET | Freq: Four times a day (QID) | ORAL | Status: DC | PRN

## 2024-09-20 MED ORDER — APIXABAN 2.5 MG PO TABS
2.5000 mg | ORAL_TABLET | Freq: Two times a day (BID) | ORAL | Status: DC
  Administered 2024-09-21: 2.5 mg via ORAL
  Filled 2024-09-20: qty 1

## 2024-09-20 MED ORDER — SODIUM CHLORIDE 0.9 % IV SOLN: INTRAVENOUS | Status: DC

## 2024-09-20 MED ORDER — EMPAGLIFLOZIN 10 MG PO TABS
10.0000 mg | ORAL_TABLET | Freq: Every day | ORAL | Status: DC
  Administered 2024-09-20 – 2024-09-21 (×2): 10 mg via ORAL
  Filled 2024-09-20 (×2): qty 1

## 2024-09-20 MED ORDER — HYDROMORPHONE HCL 1 MG/ML IJ SOLN
INTRAMUSCULAR | Status: AC
  Filled 2024-09-20: qty 1

## 2024-09-20 MED ORDER — INSULIN ASPART 100 UNIT/ML IJ SOLN
0.0000 [IU] | INTRAMUSCULAR | Status: DC | PRN
  Administered 2024-09-20: 2 [IU] via SUBCUTANEOUS
  Filled 2024-09-20: qty 2

## 2024-09-20 MED ORDER — SODIUM CHLORIDE 0.9 % IR SOLN
Status: DC | PRN
  Administered 2024-09-20: 1000 mL

## 2024-09-20 MED ORDER — PHENOL 1.4 % MT LIQD: 1.0000 | OROMUCOSAL | Status: DC | PRN

## 2024-09-20 MED ORDER — MIDAZOLAM HCL 2 MG/2ML IJ SOLN
INTRAMUSCULAR | Status: AC
  Filled 2024-09-20: qty 2

## 2024-09-20 MED ORDER — POVIDONE-IODINE 10 % EX SWAB: 2.0000 | Freq: Once | CUTANEOUS | Status: DC

## 2024-09-20 MED ORDER — BUPIVACAINE-MELOXICAM ER 400-12 MG/14ML IJ SOLN
INTRAMUSCULAR | Status: AC
  Filled 2024-09-20: qty 1

## 2024-09-20 MED ORDER — ONDANSETRON HCL 4 MG/2ML IJ SOLN: 4.0000 mg | Freq: Once | INTRAMUSCULAR | Status: DC | PRN

## 2024-09-20 MED ORDER — METHOCARBAMOL 1000 MG/10ML IJ SOLN: 500.0000 mg | Freq: Four times a day (QID) | INTRAMUSCULAR | Status: DC | PRN

## 2024-09-20 MED ORDER — APIXABAN 2.5 MG PO TABS: ORAL_TABLET | ORAL | 0 refills | Status: AC

## 2024-09-20 MED ORDER — FENTANYL CITRATE (PF) 100 MCG/2ML IJ SOLN
INTRAMUSCULAR | Status: AC
  Filled 2024-09-20: qty 2

## 2024-09-20 MED ORDER — OXYCODONE-ACETAMINOPHEN 5-325 MG PO TABS: 1.0000 | ORAL_TABLET | Freq: Four times a day (QID) | ORAL | 0 refills | Status: AC | PRN

## 2024-09-20 MED ORDER — FENTANYL CITRATE (PF) 100 MCG/2ML IJ SOLN
INTRAMUSCULAR | Status: DC | PRN
  Administered 2024-09-20 (×2): 50 ug via INTRAVENOUS

## 2024-09-20 MED ORDER — DEXMEDETOMIDINE HCL IN NACL 80 MCG/20ML IV SOLN
INTRAVENOUS | Status: DC | PRN
  Administered 2024-09-20: 8 ug via INTRAVENOUS

## 2024-09-20 MED ORDER — 0.9 % SODIUM CHLORIDE (POUR BTL) OPTIME
TOPICAL | Status: DC | PRN
  Administered 2024-09-20: 1000 mL

## 2024-09-20 MED ORDER — METOCLOPRAMIDE HCL 5 MG PO TABS: 5.0000 mg | ORAL_TABLET | Freq: Three times a day (TID) | ORAL | Status: DC | PRN

## 2024-09-20 MED ORDER — GLYCOPYRROLATE 0.2 MG/ML IJ SOLN
INTRAMUSCULAR | Status: DC | PRN
  Administered 2024-09-20: .2 mg via INTRAVENOUS

## 2024-09-20 MED ORDER — LACTATED RINGERS IV SOLN: INTRAVENOUS | Status: DC | PRN

## 2024-09-20 MED ORDER — PHENYLEPHRINE 80 MCG/ML (10ML) SYRINGE FOR IV PUSH (FOR BLOOD PRESSURE SUPPORT)
PREFILLED_SYRINGE | INTRAVENOUS | Status: DC | PRN
  Administered 2024-09-20: 80 ug via INTRAVENOUS

## 2024-09-20 MED ORDER — DROPERIDOL 2.5 MG/ML IJ SOLN: 0.6250 mg | Freq: Once | INTRAMUSCULAR | Status: DC | PRN

## 2024-09-20 MED ORDER — MIDAZOLAM HCL 5 MG/5ML IJ SOLN
INTRAMUSCULAR | Status: DC | PRN
  Administered 2024-09-20 (×2): 1 mg via INTRAVENOUS

## 2024-09-20 MED ORDER — METHOCARBAMOL 500 MG PO TABS
500.0000 mg | ORAL_TABLET | Freq: Four times a day (QID) | ORAL | Status: DC | PRN
  Administered 2024-09-20 – 2024-09-21 (×3): 500 mg via ORAL
  Filled 2024-09-20 (×2): qty 1

## 2024-09-20 MED ORDER — PROPRANOLOL HCL ER 60 MG PO CP24
60.0000 mg | ORAL_CAPSULE | Freq: Every day | ORAL | Status: DC
  Administered 2024-09-20 – 2024-09-21 (×2): 60 mg via ORAL
  Filled 2024-09-20 (×2): qty 1

## 2024-09-20 MED ORDER — OXYCODONE HCL 5 MG PO TABS
ORAL_TABLET | ORAL | Status: AC
  Filled 2024-09-20: qty 1

## 2024-09-20 MED ORDER — TRANEXAMIC ACID-NACL 1000-0.7 MG/100ML-% IV SOLN
1000.0000 mg | Freq: Once | INTRAVENOUS | Status: AC
  Administered 2024-09-20: 1000 mg via INTRAVENOUS
  Filled 2024-09-20: qty 100

## 2024-09-20 MED ORDER — DEXAMETHASONE SOD PHOSPHATE PF 10 MG/ML IJ SOLN
10.0000 mg | Freq: Once | INTRAMUSCULAR | Status: AC
  Administered 2024-09-21: 10 mg via INTRAVENOUS
  Filled 2024-09-20: qty 1

## 2024-09-20 MED ORDER — ONDANSETRON HCL 4 MG PO TABS: 4.0000 mg | ORAL_TABLET | Freq: Four times a day (QID) | ORAL | Status: DC | PRN

## 2024-09-20 MED ORDER — METHOCARBAMOL 750 MG PO TABS: 750.0000 mg | ORAL_TABLET | Freq: Three times a day (TID) | ORAL | 2 refills | Status: AC | PRN

## 2024-09-20 MED ORDER — GLIPIZIDE 5 MG PO TABS
10.0000 mg | ORAL_TABLET | Freq: Two times a day (BID) | ORAL | Status: DC
  Administered 2024-09-20 – 2024-09-21 (×3): 10 mg via ORAL
  Filled 2024-09-20 (×3): qty 2

## 2024-09-20 MED ORDER — STERILE WATER FOR IRRIGATION IR SOLN
Status: DC | PRN
  Administered 2024-09-20: 2000 mL

## 2024-09-20 MED ORDER — ONDANSETRON HCL 4 MG/2ML IJ SOLN: 4.0000 mg | Freq: Four times a day (QID) | INTRAMUSCULAR | Status: DC | PRN

## 2024-09-20 MED ORDER — ONDANSETRON HCL 4 MG PO TABS: 4.0000 mg | ORAL_TABLET | Freq: Three times a day (TID) | ORAL | 0 refills | Status: AC | PRN

## 2024-09-20 MED ORDER — ROPIVACAINE HCL 5 MG/ML IJ SOLN
INTRAMUSCULAR | Status: DC | PRN
  Administered 2024-09-20: 30 mL via PERINEURAL

## 2024-09-20 MED ORDER — KETOROLAC TROMETHAMINE 15 MG/ML IJ SOLN
INTRAMUSCULAR | Status: AC
  Filled 2024-09-20: qty 1

## 2024-09-20 MED ORDER — BUPIVACAINE-MELOXICAM ER 200-6 MG/7ML IJ SOLN
INTRAMUSCULAR | Status: DC | PRN
  Administered 2024-09-20: 400 mg

## 2024-09-20 MED ORDER — INSULIN ASPART 100 UNIT/ML IJ SOLN: 0.0000 [IU] | Freq: Every day | INTRAMUSCULAR | Status: DC

## 2024-09-20 MED ORDER — ISOPROPYL ALCOHOL 70 % SOLN
Status: AC
  Filled 2024-09-20: qty 480

## 2024-09-20 MED ORDER — TRANEXAMIC ACID 1000 MG/10ML IV SOLN
INTRAVENOUS | Status: DC | PRN
  Administered 2024-09-20: 2000 mg via TOPICAL

## 2024-09-20 MED ORDER — KETOROLAC TROMETHAMINE 15 MG/ML IJ SOLN
7.5000 mg | Freq: Four times a day (QID) | INTRAMUSCULAR | Status: AC
  Administered 2024-09-20 – 2024-09-21 (×4): 7.5 mg via INTRAVENOUS
  Filled 2024-09-20 (×3): qty 1

## 2024-09-20 MED ORDER — TRANEXAMIC ACID-NACL 1000-0.7 MG/100ML-% IV SOLN
1000.0000 mg | INTRAVENOUS | Status: AC
  Administered 2024-09-20: 1000 mg via INTRAVENOUS
  Filled 2024-09-20: qty 100

## 2024-09-20 NOTE — Plan of Care (Signed)
" °  Problem: Education: Goal: Knowledge of the prescribed therapeutic regimen will improve Outcome: Progressing   Problem: Bowel/Gastric: Goal: Gastrointestinal status for postoperative course will improve Outcome: Progressing   Problem: Cardiac: Goal: Ability to maintain an adequate cardiac output Outcome: Progressing   "

## 2024-09-20 NOTE — Anesthesia Procedure Notes (Signed)
 Spinal  Patient location during procedure: OR Start time: 09/20/2024 7:43 AM End time: 09/20/2024 7:48 AM Reason for block: surgical anesthesia  Staffing Performed: anesthesiologist  Authorized by: Jerrye Sharper, MD   Performed by: Jerrye Sharper, MD  Preanesthetic Checklist Completed: patient identified, IV checked, site marked, risks and benefits discussed, surgical consent, monitors and equipment checked, pre-op evaluation and timeout performed Spinal Block Patient position: sitting Prep: DuraPrep and site prepped and draped Patient monitoring: heart rate, cardiac monitor, continuous pulse ox and blood pressure Approach: midline Location: L3-4 Injection technique: single-shot Needle Needle type: Pencan  Needle gauge: 24 G Needle length: 9 cm Needle insertion depth (cm): 7 Assessment Sensory level: T4 Events: CSF return  Additional Notes Patient tolerated procedure well. Adequate sensory level.Technically difficult. Attempt x 3

## 2024-09-20 NOTE — Evaluation (Signed)
 Physical Therapy Evaluation Patient Details Name: Charles Payne MRN: [id number] DOB: 1961/07/04 Today's Date: 09/20/2024  History of Present Illness  P s/p R TKR and with hx of DM< obesity, PE, L THR, and bil hand tremor  Clinical Impression  Pt s/p R TKR and presents with deceased R LE strength/ROM and post op pain limiting functional mobility.  Pt should progress to dc friends home for recovery.        If plan is discharge home, recommend the following:     Can travel by private vehicle        Equipment Recommendations None recommended by PT  Recommendations for Other Services       Functional Status Assessment Patient has had a recent decline in their functional status and demonstrates the ability to make significant improvements in function in a reasonable and predictable amount of time.     Precautions / Restrictions Precautions Precautions: Fall Restrictions Weight Bearing Restrictions Per Provider Order: Yes RLE Weight Bearing Per Provider Order: Weight bearing as tolerated      Mobility  Bed Mobility Overal bed mobility: Needs Assistance Bed Mobility: Supine to Sit, Sit to Supine     Supine to sit: Supervision Sit to supine: Supervision   General bed mobility comments: for safety but no physical assist    Transfers Overall transfer level: Needs assistance Equipment used: Rolling walker (2 wheels) Transfers: Sit to/from Stand Sit to Stand: Min assist           General transfer comment: cues for LE management and use of UEs to self assist    Ambulation/Gait Ambulation/Gait assistance: Min assist, Contact guard assist Gait Distance (Feet): 75 Feet Assistive device: Rolling walker (2 wheels) Gait Pattern/deviations: Step-to pattern, Step-through pattern, Decreased step length - right, Decreased step length - left, Shuffle, Trunk flexed Gait velocity: decr     General Gait Details: Steady assist with cues for sequence, posture and position from  [employer]            Wheelchair Mobility     Tilt Bed    Modified Rankin (Stroke Patients Only)       Balance Overall balance assessment: Mild deficits observed, not formally tested                                           Pertinent Vitals/Pain Pain Assessment Pain Assessment: 0-10 Pain Score: 5  Pain Location: R knee Pain Descriptors / Indicators: Aching, Sore Pain Intervention(s): Limited activity within patient's tolerance, Monitored during session, Premedicated before session, Ice applied    Home Living Family/patient expects to be discharged to:: Private residence Living Arrangements: Other (Comment) (plans to [employer] with Friend to [facility]) Available Help at Discharge: Friend(s);Available 24 hours/day Type of Home: House Home Access: Stairs to enter;Level entry (level entry outside vs flight from garage) Entrance Stairs-Rails: Right;Left Entrance Stairs-Number of Steps: flight   Home Layout: One level Home Equipment: [employer] (2 wheels);Cane - single point;Crutches      Prior Function Prior Level of Function : Independent/Modified Independent                     Extremity/Trunk Assessment   Upper Extremity Assessment Upper Extremity Assessment: Overall WFL for tasks assessed    Lower Extremity Assessment Lower Extremity Assessment: RLE deficits/detail    Cervical / Trunk Assessment Cervical / Trunk Assessment:  Normal  Communication   Communication Communication: No apparent difficulties    Cognition Arousal: Alert Behavior During Therapy: WFL for tasks assessed/performed   PT - Cognitive impairments: No apparent impairments                         Following commands: Intact       Cueing Cueing Techniques: Verbal cues     General Comments      Exercises Total Joint Exercises Ankle Circles/Pumps: AROM, Both, 15 reps, Supine Quad Sets: AROM, Both, 5 reps, Supine   Assessment/Plan    PT  Assessment Patient needs continued PT services  PT Problem List Decreased strength;Decreased range of motion;Decreased activity tolerance;Decreased balance;Decreased mobility;Decreased knowledge of use of DME;Obesity;Pain       PT Treatment Interventions DME instruction;Gait training;Stair training;Functional mobility training;Therapeutic activities;Therapeutic exercise;Patient/family education    PT Goals (Current goals can be found in the Care Plan section)  Acute Rehab PT Goals Patient Stated Goal: REgain IND PT Goal Formulation: With patient Time For Goal Achievement: 09/20/24 Potential to Achieve Goals: Good    Frequency 7X/week     Co-evaluation               AM-PAC PT 6 Clicks Mobility  Outcome Measure Help needed turning from your back to your side while in a flat bed without using bedrails?: A Little Help needed moving from lying on your back to sitting on the side of a flat bed without using bedrails?: A Little Help needed moving to and from a bed to a chair (including a wheelchair)?: A Little Help needed standing up from a chair using your arms (e.g., wheelchair or bedside chair)?: A Little Help needed to walk in hospital room?: A Little Help needed climbing 3-5 steps with a railing? : A Lot 6 Click Score: 17    End of Session Equipment Utilized During Treatment: Gait belt Activity Tolerance: Patient tolerated treatment well Patient left: in bed;with call bell/phone within reach;with bed alarm set;with family/visitor present Nurse Communication: Mobility status PT Visit Diagnosis: Unsteadiness on feet (R26.81);Difficulty in walking, not elsewhere classified (R26.2)    Time: 8492-8452 PT Time Calculation (min) (ACUTE ONLY): 40 min   Charges:   PT Evaluation $PT Eval Low Complexity: 1 Low PT Treatments $Gait Training: 8-22 mins PT General Charges $$ ACUTE PT VISIT: 1 Visit         [facility] PT Acute Rehabilitation Services Office  [phone]   Orianna Biskup 09/20/2024, 5:41 PM

## 2024-09-20 NOTE — Op Note (Addendum)
 "  Total Knee Arthroplasty Procedure Note  Preoperative diagnosis: Right knee osteoarthritis  Postoperative diagnosis:same  Operative findings: Complete loss of articular cartilage from all 3 compartments Excellent bone quality Successful correction of flexion contracture, mild valgus deformity  Operative procedure: Right total knee arthroplasty. CPT [phone]  Surgeon: N. Ozell Cummins, MD  Assist: Ronal Morna Grave, PA-C; necessary for the timely completion of procedure and due to complexity of procedure.  Anesthesia: Spinal, regional  Tourniquet time: see anesthesia record  Implants: Zimmer persona press fit Femur: CR 9 Tibia: H Patella: 38 mm Polyethylene: 11 mm medial congruent  Indication: Charles Payne is a 64 y.o. year old male with a history of knee pain. Having failed conservative management, the patient elected to proceed with a total knee arthroplasty.  We have reviewed the risk and benefits of the surgery and they elected to proceed after voicing understanding.  Procedure:  After informed consent was obtained and understanding of the risk were voiced including but not limited to bleeding, infection, damage to surrounding structures including nerves and vessels, blood clots, leg length inequality and the failure to achieve desired results, the operative extremity was marked with verbal confirmation of the patient in the holding area.   The patient was then brought to the operating room and transported to the operating room table in the supine position.  A tourniquet was applied to the operative extremity around the upper thigh. The operative limb was then prepped and draped in the usual sterile fashion and preoperative antibiotics were administered.  A time out was performed prior to the start of surgery confirming the correct extremity, preoperative antibiotic administration, as well as team members, implants and instruments available for the case. Correct surgical site  was also confirmed with preoperative radiographs. The limb was then elevated for exsanguination and the tourniquet was inflated. A midline incision was made and a standard medial parapatellar approach was performed.  The patella was everted which showed complete loss of articular cartilage.  The patella was resected down to 16 mm from 26 mm and sized to a 38 mm.  A cover was placed on the patella for protection from retractors.  We then turned our attention to the femur.  The remnant of the ACL was sacrificed. Start site was drilled in the femur and the intramedullary distal femoral cutting guide was placed, set at 5 degrees valgus, taking 11 mm of distal resection. The distal cut was made. Osteophytes were then removed.   Next, the proximal tibial cutting guide was placed with appropriate slope, varus/valgus alignment and depth of resection. A drop rod was attached to confirm that it was pointed to the second metatarsal.  Lateral wear was most severe.  The proximal tibial cut was made taking 2 mm off the lower lateral side. Gap blocks were then used to assess the extension gap and alignment, and appropriate soft tissue releases were performed. Attention was turned back to the femur, which was sized using the sizing guide to a size 9 standard. Appropriate rotation of the femoral component was determined using epicondylar axis, Whitesides line, and assessing the flexion gap under ligament tension. The appropriate size 4-in-1 cutting block was placed and cuts were made.  Posterior femoral osteophytes and uncapped bone were then removed with the curved osteotome.  Trial components were placed, and stability was checked in full extension, mid-flexion, and deep flexion.  A 11 mm poly insert allowed full extension and gravity flexion to 115 degrees.  A couple of millimeters  of varus/valgus jog.  The remnant of the PCL was resected.  The patella tracked well without a lateral release. Trial components were then  removed and tibial preparation performed.  The tibial trial was pointed to the medial third of the tibial tubercle.  The tibia was sized for a size H component and prepped.  Trial components were removed.   The bony surfaces were irrigated with a pulse lavage and then dried. Final components were placed.  The final polyethylene liner, 11 mm thick, was inserted and checked to ensure the locking mechanism had engaged appropriately.  The stability of the construct was re-evaluated throughout a range of motion and found to be acceptable.  The tourniquet was deflated and hemostasis was achieved. The wound was irrigated with normal saline.  One gram of vancomycin  powder was placed in the surgical bed.  Topical mixture of 0.25% bupivacaine  and meloxicam  was placed in the joint for postoperative pain.  Capsular closure was performed with a #1 statafix in flexion, subcutaneous fat closed with a 0 vicryl suture, then subcutaneous tissue closed with interrupted 2.0 vicryl suture. The skin was then closed with a 2.0 nylon and dermabond. A sterile dressing was applied.  The patient was awakened in the operating room and taken to recovery in stable condition. All sponge, needle, and instrument counts were correct at the end of the case.  Morna Grave was necessary for opening, closing, retracting, limb positioning and overall facilitation and completion of the surgery.  Position: supine  Complications: none.  Time Out: performed  Drains/Packing: none Estimated blood loss: minimal Returned to Recovery Room: in good condition.   Mechanical VTE (DVT) Prophylaxis: sequential compression devices, TED thigh-high  Chemical VTE (DVT) Prophylaxis: eliquis  POD 1  Fluid Replacement  Crystalloid: see anesthesia record Blood: none  FFP: none   Specimens Removed: 1 to pathology  Sponge and Instrument Count Correct? yes  PACU: portable radiograph - knee AP and Lateral  Plan/RTC: Return in 2 weeks for suture removal.   Weight Bearing/Load Lower Extremity: full   Implant Name Type Inv. Item Serial No. Manufacturer Lot No. LRB No. Used Action  COMPONET TIB PS KNEE 0D H RT - [id number] Joint COMPONET TIB PS KNEE 0D H RT  [employer]MER [employer](ORTH,TRAU,BIO,SG) [id number] Right 1 Implanted  COMPONENT PATELLA 3 PEG 38 - [id number] Joint COMPONENT PATELLA 3 PEG 38  [employer]MER [employer](ORTH,TRAU,BIO,SG) [id number] Right 1 Implanted  INSERT TIBIAL PERSONA RT 11 - [id number] Joint INSERT TIBIAL PERSONA RT 11  [employer]MER [employer](ORTH,TRAU,BIO,SG) [id number] Right 1 Implanted  PROSTHESIS FEM KNEE PS STD9 RT - [id number] Joint PROSTHESIS FEM KNEE PS STD9 RT  [employer](ORTH,TRAU,BIO,SG) [id number] Right 1 Implanted    N. Ozell Cummins, MD [facility] 9:06 AM  "

## 2024-09-20 NOTE — Anesthesia Procedure Notes (Signed)
 Anesthesia Regional Block: Adductor canal block   Pre-Anesthetic Checklist: , timeout performed,  Correct Patient, Correct Site, Correct Laterality,  Correct Procedure, Correct Position, site marked,  Risks and benefits discussed,  Surgical consent,  Pre-op evaluation,  At surgeon's request and post-op pain management  Laterality: Right  Prep: chloraprep       Needles:  Injection technique: Single-shot  Needle Type: Echogenic Stimulator Needle      Needle Gauge: 21     Additional Needles:   Procedures:,,,, ultrasound used (permanent image in chart),,    Narrative:  Start time: 09/20/2024 7:10 AM End time: 09/20/2024 7:15 AM Injection made incrementally with aspirations every 5 mL.  Performed by: Personally  Anesthesiologist: Jerrye Sharper, MD  Additional Notes: Timeout performed. Patient sedated. Relevant anatomy ID'd using US . Incremental 2-5ml injection of LA with frequent aspiration. Patient tolerated procedure well.

## 2024-09-20 NOTE — Progress Notes (Signed)
 Orthopedic Tech Progress Note Patient Details:  Charles Payne 1961-06-13 [id number]  Ortho Devices Type of Ortho Device: Bone foam zero knee Ortho Device/Splint Location: Right knee Ortho Device/Splint Interventions: Application   Post Interventions Patient Tolerated: Well  Massie BRAVO [name] 09/20/2024, 9:39 AM

## 2024-09-20 NOTE — Discharge Instructions (Addendum)
 Information on my medicine - ELIQUIS (apixaban)  Why was Eliquis prescribed for you? Eliquis was prescribed for you to reduce the risk of blood clots forming after orthopedic surgery.    What do You need to know about Eliquis? Take your Eliquis TWICE DAILY - one tablet in the morning and one tablet in the evening with or without food.  It would be best to take the dose about the same time each day.  If you have difficulty swallowing the tablet whole please discuss with your pharmacist how to take the medication safely.  Take Eliquis exactly as prescribed by your doctor and DO NOT stop taking Eliquis without talking to the doctor who prescribed the medication.  Stopping without other medication to take the place of Eliquis may increase your risk of developing a clot.  After discharge, you should have regular check-up appointments with your healthcare provider that is prescribing your Eliquis.  What do you do if you miss a dose? If a dose of ELIQUIS is not taken at the scheduled time, take it as soon as possible on the same day and twice-daily administration should be resumed.  The dose should not be doubled to make up for a missed dose.  Do not take more than one tablet of ELIQUIS at the same time.  Important Safety Information A possible side effect of Eliquis is bleeding. You should call your healthcare provider right away if you experience any of the following: Bleeding from an injury or your nose that does not stop. Unusual colored urine (red or dark brown) or unusual colored stools (red or black). Unusual bruising for unknown reasons. A serious fall or if you hit your head (even if there is no bleeding).  Some medicines may interact with Eliquis and might increase your risk of bleeding or clotting while on Eliquis. To help avoid this, consult your healthcare provider or pharmacist prior to using any new prescription or non-prescription medications, including herbals,  vitamins, non-steroidal anti-inflammatory drugs (NSAIDs) and supplements.  This website has more information on Eliquis (apixaban): http://www.eliquis.com/eliquis/home

## 2024-09-20 NOTE — Anesthesia Postprocedure Evaluation (Signed)
"   Anesthesia Post Note  Patient: DAVON ABDELAZIZ  Procedure(s) Performed: ARTHROPLASTY, KNEE, TOTAL (Right: Knee)     Patient location during evaluation: PACU Anesthesia Type: Spinal Level of consciousness: oriented and awake and alert Pain management: pain level controlled Vital Signs Assessment: post-procedure vital signs reviewed and stable Respiratory status: spontaneous breathing, respiratory function stable and nonlabored ventilation Cardiovascular status: blood pressure returned to baseline and stable Postop Assessment: no headache, no backache, no apparent nausea or vomiting, spinal receding and patient able to bend at knees Anesthetic complications: no   No notable events documented.  Last Vitals:  Vitals:   09/20/24 1300 09/20/24 1320  BP: (!) 142/72 (!) 152/90  Pulse: (!) 58 (!) 59  Resp: 17 18  Temp:  36.7 C  SpO2: 97% 97%    Last Pain:  Vitals:   09/20/24 1320  TempSrc: Oral  PainSc: 7                  Carolyn Maniscalco A.      "

## 2024-09-20 NOTE — H&P (Signed)
 "   PREOPERATIVE H&P  Chief Complaint: right knee osteoarthritis  HPI: Charles Payne is a 64 y.o. male who presents for surgical treatment of right knee osteoarthritis.  He denies any changes in medical history.  Past Surgical History:  Procedure Laterality Date   KNEE ARTHROSCOPY W/ DEBRIDEMENT Right    PARTIAL HIP ARTHROPLASTY Left 2021   done in [facility]   Social History   Socioeconomic History   Marital status: Divorced    Spouse name: Not on file   Number of children: 2   Years of education: Not on file   Highest education level: Some college, no degree  Occupational History   Not on file  Tobacco Use   Smoking status: Former    Current packs/day: 0.00    Types: Cigarettes    Quit date: 1973    Years since quitting: 53.1   Smokeless tobacco: Never  Vaping Use   Vaping status: Never Used  Substance and Sexual Activity   Alcohol  use: Yes    Alcohol /week: 4.0 standard drinks of alcohol     Types: 4 Cans of beer per week   Drug use: Yes    Types: Marijuana    Comment: every couple of months, most recently used 11/2023   Sexual activity: Not Currently  Other Topics Concern   Not on file  Social History Narrative   One grandchild    Social Drivers of Health   Tobacco Use: Medium Risk (09/20/2024)   Patient History    Smoking Tobacco Use: Former    Smokeless Tobacco Use: Never    Passive Exposure: Not on file  Financial Resource Strain: Medium Risk (06/10/2024)   Overall Financial Resource Strain (CARDIA)    Difficulty of Paying Living Expenses: Somewhat hard  Food Insecurity: No Food Insecurity (06/10/2024)   Epic    Worried About Programme Researcher, Broadcasting/film/video in the Last Year: Never true    Ran Out of Food in the Last Year: Never true  Recent Concern: Food Insecurity - Food Insecurity Present (04/05/2024)   Epic    Worried About Programme Researcher, Broadcasting/film/video in the Last Year: Sometimes true    Ran Out of Food in the Last Year: Sometimes true   Transportation Needs: No Transportation Needs (06/10/2024)   Epic    Lack of Transportation (Medical): No    Lack of Transportation (Non-Medical): No  Physical Activity: Insufficiently Active (06/10/2024)   Exercise Vital Sign    Days of Exercise per Week: 3 days    Minutes of Exercise per Session: 30 min  Stress: No Stress Concern Present (06/10/2024)   [employer] of Occupational Health - Occupational Stress Questionnaire    Feeling of Stress: Only a little  Social Connections: Moderately Isolated (06/10/2024)   Social Connection and Isolation Panel    Frequency of Communication with Friends and Family: Three times a week    Frequency of Social Gatherings with Friends and Family: Once a week    Attends Religious Services: More than 4 times per year    Active Member of Clubs or Organizations: No    Attends Banker Meetings: Not on file    Marital Status: Divorced  Depression (PHQ2-9): Low Risk (04/09/2024)   Depression (PHQ2-9)    PHQ-2 Score: 0  Alcohol  Screen: Low Risk (06/10/2024)   Alcohol  Screen    Last Alcohol  Screening Score (AUDIT): 4  Housing: Unknown (06/10/2024)   Epic    Unable to Pay for Housing in the Last Year:  No    Number of Times Moved in the Last Year: Not on file    Homeless in the Last Year: No  Utilities: Not on file  Health Literacy: Not on file   Family History  Problem Relation Age of Onset   Melanoma Mother    Esophageal cancer Neg Hx    Rectal cancer Neg Hx    Stomach cancer Neg Hx    Colon polyps Neg Hx    No Known Allergies Prior to Admission medications  Medication Sig Start Date End Date Taking? Authorizing Provider  aspirin 81 MG chewable tablet Chew 81 mg by mouth in the morning. 06/08/23  Yes [provider]  glipiZIDE  (GLUCOTROL ) 10 MG tablet Take 1 tablet (10 mg total) by mouth 2 (two) times daily. 05/14/24  Yes [name], MD  lisinopril  (ZESTRIL ) 5 MG tablet Take 1 tablet (5 mg  total) by mouth in the morning. 07/11/24  Yes [name], MD  metFORMIN (GLUCOPHAGE) 1000 MG tablet TAKE 1 TABLET BY MOUTH TWICE A DAY WITH MORNING AND EVENING MEALS Patient taking differently: Take 500 mg by mouth 2 (two) times daily with a meal. 10/01/19  Yes [provider]  nicotine  polacrilex (COMMIT) 2 MG lozenge RX #2 Weeks 7-9: 1 lozenge every 2-4 hours.Max 20 lozenges per day. 06/13/24  Yes [name], MD  nystatin  (MYCOSTATIN /NYSTOP ) powder Apply 1 Application topically 3 (three) times daily. Apply to affected area three times daily. 09/12/24  Yes Paci, Karina M, MD  ondansetron  (ZOFRAN ) 4 MG tablet Take 1 tablet (4 mg total) by mouth every 8 (eight) hours as needed for nausea or vomiting. 07/30/24  Yes Stacia Glendia BRAVO, MD  ondansetron  (ZOFRAN ) 4 MG tablet Take 1 tablet (4 mg total) by mouth every 8 (eight) hours as needed for nausea or vomiting. 09/12/24  Yes Jule Ronal CROME, PA-C  Polyethyl Glycol-Propyl Glycol (LUBRICANT EYE DROPS) 0.4-0.3 % SOLN Place 1-2 drops into both eyes 3 (three) times daily as needed (dry/irritated eyes). Patient not taking: Reported on 09/12/2024   Yes [provider]  propranolol  ER (INDERAL  LA) 60 MG 24 hr capsule Take 1 capsule (60 mg total) by mouth daily. Replaces 20 mg dosing 06/13/24  Yes [name], MD  rosuvastatin  (CRESTOR ) 20 MG tablet Take 1 tablet (20 mg total) by mouth daily. Patient taking differently: Take 20 mg by mouth every evening. 04/09/24  Yes [name], MD  apixaban  (ELIQUIS ) 2.5 MG TABS tablet Take one tab po bid x 30 days after surgery to prevent blood clots Patient not taking: Reported on 09/12/2024 09/12/24   Jule Ronal CROME, PA-C  Blood Glucose Monitoring Suppl DEVI 1 each by Does not apply route in the morning, at noon, and at bedtime. May substitute to any manufacturer covered by patient's insurance. Patient not taking: No sig reported 08/11/23   Crain, Whitney L, PA  docusate sodium  (COLACE)  100 MG capsule Take 1 capsule (100 mg total) by mouth daily as needed. Patient not taking: Reported on 09/12/2024 09/12/24 09/12/25  Jule Ronal CROME, PA-C  doxycycline  (VIBRA -TABS) 100 MG tablet Take 1 tablet (100 mg total) by mouth 2 (two) times daily. To be taken after surgery Patient not taking: Reported on 09/12/2024 09/12/24   Jule Ronal CROME, PA-C  empagliflozin  (JARDIANCE ) 10 MG TABS tablet Take 1 tablet (10 mg total) by mouth daily. 08/19/24   [name], MD  fluocinonide  cream (LIDEX ) 0.05 % Apply 1 Application topically 2 (two)  times daily. Apply to affected area two times daily. 09/12/24   Paci, Karina M, MD  methocarbamol  (ROBAXIN ) 750 MG tablet Take 1 tablet (750 mg total) by mouth 3 (three) times daily as needed. Patient not taking: Reported on 09/12/2024 09/12/24   Jule Ronal CROME, PA-C  oxyCODONE -acetaminophen  (PERCOCET) 5-325 MG tablet Take 1-2 tablets by mouth every 6 (six) hours as needed. To be taken after surgery Patient not taking: Reported on 09/12/2024 09/12/24   Jule Ronal CROME, PA-C  Semaglutide ,0.25 or 0.5MG /DOS, (OZEMPIC , 0.25 OR 0.5 MG/DOSE,) 2 MG/3ML SOPN Inject 0.5 mg into the skin once a week. 06/13/24   [name], MD     Positive ROS: All other systems have been reviewed and were otherwise negative with the exception of those mentioned in the HPI and as above.  Physical Exam: General: Alert, no acute distress Cardiovascular: No pedal edema Respiratory: No cyanosis, no use of accessory musculature GI: abdomen soft Skin: No lesions in the area of chief complaint Neurologic: Sensation intact distally Psychiatric: Patient is competent for consent with normal mood and affect Lymphatic: no lymphedema  MUSCULOSKELETAL: exam stable  Assessment: right knee osteoarthritis  Plan: Plan for Procedures: ARTHROPLASTY, KNEE, TOTAL  The risks benefits and alternatives were discussed with the patient including but not limited to the risks of nonoperative  treatment, versus surgical intervention including infection, bleeding, nerve injury,  blood clots, cardiopulmonary complications, morbidity, mortality, among others, and they were willing to proceed.   Ozell Cummins, MD 09/20/2024 6:31 AM  "

## 2024-09-21 ENCOUNTER — Other Ambulatory Visit: Payer: Self-pay | Admitting: Orthopaedic Surgery

## 2024-09-21 ENCOUNTER — Encounter: Payer: Self-pay | Admitting: Orthopaedic Surgery

## 2024-09-21 LAB — GLUCOSE, CAPILLARY: Glucose-Capillary: 154 mg/dL — ABNORMAL HIGH (ref 70–99)

## 2024-09-21 MED ORDER — NICOTINE 21 MG/24HR TD PT24
21.0000 mg | MEDICATED_PATCH | Freq: Every day | TRANSDERMAL | Status: DC
  Administered 2024-09-21: 21 mg via TRANSDERMAL
  Filled 2024-09-21: qty 1

## 2024-09-21 MED ORDER — METHOCARBAMOL 500 MG PO TABS: 500.0000 mg | ORAL_TABLET | Freq: Four times a day (QID) | ORAL | 0 refills | Status: AC

## 2024-09-21 MED ORDER — OXYCODONE-ACETAMINOPHEN 5-325 MG PO TABS: 1.0000 | ORAL_TABLET | ORAL | 0 refills | Status: AC | PRN

## 2024-09-21 MED ORDER — DOXYCYCLINE HYCLATE 100 MG PO TABS: 100.0000 mg | ORAL_TABLET | Freq: Two times a day (BID) | ORAL | 0 refills | Status: AC

## 2024-09-21 MED ORDER — APIXABAN 2.5 MG PO TABS: 2.5000 mg | ORAL_TABLET | Freq: Two times a day (BID) | ORAL | 0 refills | Status: AC

## 2024-09-21 NOTE — Progress Notes (Signed)
 Physical Therapy Treatment Patient Details Name: Charles Payne MRN: [id number] DOB: [birth year] Today's Date: 09/21/2024   History of Present Illness P s/p R TKR and with hx of DM< obesity, PE, L THR, and bil hand tremor    PT Comments  Pt continues motivated and with improved pain control.  Pt up to ambulate increased distance in hall and negotiated stairs at CGA/SUP level.  Pt eager for dc home this date.   If plan is discharge home, recommend the following:     Can travel by private vehicle        Equipment Recommendations  None recommended by PT    Recommendations for Other Services       Precautions / Restrictions Precautions Precautions: Fall;Knee Restrictions Weight Bearing Restrictions Per Provider Order: Yes RLE Weight Bearing Per Provider Order: Weight bearing as tolerated     Mobility  Bed Mobility               General bed mobility comments: Pt up in chair and returns to same    Transfers Overall transfer level: Needs assistance Equipment used: Rolling walker (2 wheels) Transfers: Sit to/from Stand Sit to Stand: Contact guard assist, Supervision           General transfer comment: cues for LE management and use of UEs to self assist    Ambulation/Gait Ambulation/Gait assistance: Contact guard assist, Supervision Gait Distance (Feet): 130 Feet Assistive device: Rolling walker (2 wheels) Gait Pattern/deviations: Step-to pattern, Step-through pattern, Decreased step length - right, Decreased step length - left, Shuffle, Trunk flexed Gait velocity: decr     General Gait Details: cues for posture, position from RW, and safety awareness   Stairs Stairs: Yes Stairs assistance: Contact guard assist Stair Management: One rail Left, Step to pattern, Forwards, With crutches Number of Stairs: 3 General stair comments: Pt self cues for sequencing   Wheelchair Mobility     Tilt Bed    Modified Rankin (Stroke Patients Only)        Balance Overall balance assessment: Mild deficits observed, not formally tested                                          Communication Communication Communication: No apparent difficulties  Cognition Arousal: Alert Behavior During Therapy: WFL for tasks assessed/performed   PT - Cognitive impairments: No apparent impairments                         Following commands: Intact      Cueing Cueing Techniques: Verbal cues  Exercises Total Joint Exercises Ankle Circles/Pumps: AROM, Both, 15 reps, Supine Quad Sets: AROM, Both, Supine, 10 reps Heel Slides: AAROM, Right, 15 reps, Supine Straight Leg Raises: AAROM, Right, 10 reps, Supine Long Arc Quad: AAROM, Right, 10 reps, Seated    General Comments        Pertinent Vitals/Pain Pain Assessment Pain Assessment: 0-10 Pain Score: 4  Pain Location: R knee Pain Descriptors / Indicators: Aching, Sore Pain Intervention(s): Monitored during session, Limited activity within patient's tolerance, Premedicated before session, Ice applied    Home Living                          Prior Function            PT Goals (current goals can now  be found in the care plan section) Acute Rehab PT Goals Patient Stated Goal: REgain IND PT Goal Formulation: With patient Time For Goal Achievement: 09/20/24 Potential to Achieve Goals: Good Progress towards PT goals: Progressing toward goals    Frequency    7X/week      PT Plan      Co-evaluation              AM-PAC PT 6 Clicks Mobility   Outcome Measure  Help needed turning from your back to your side while in a flat bed without using bedrails?: A Little Help needed moving from lying on your back to sitting on the side of a flat bed without using bedrails?: A Little Help needed moving to and from a bed to a chair (including a wheelchair)?: A Little Help needed standing up from a chair using your arms (e.g., wheelchair or bedside chair)?: A  Little Help needed to walk in hospital room?: A Little Help needed climbing 3-5 steps with a railing? : A Little 6 Click Score: 18    End of Session Equipment Utilized During Treatment: Gait belt Activity Tolerance: Patient tolerated treatment well;Patient limited by pain Patient left: in chair;with call bell/phone within reach;with family/visitor present Nurse Communication: Mobility status PT Visit Diagnosis: Unsteadiness on feet (R26.81);Difficulty in walking, not elsewhere classified (R26.2)     Time: 8895-8873 PT Time Calculation (min) (ACUTE ONLY): 22 min  Charges:    $Gait Training: 8-22 mins $Therapeutic Exercise: 23-37 mins PT General Charges $$ ACUTE PT VISIT: 1 Visit                     [facility] PT Acute Rehabilitation Services Office [phone]    Leston Schueller 09/21/2024, 12:45 PM

## 2024-09-21 NOTE — Progress Notes (Signed)
" ° °  Subjective:  Patient reports pain as mild. Having some difficulty mobilizing. Tolerating diet.  Objective:   VITALS:   Vitals:   09/20/24 1550 09/20/24 2002 09/20/24 2355 09/21/24 0609  BP: (!) 146/82 127/74 (!) 143/79 (!) 173/88  Pulse: (!) 53 (!) 57 (!) 55 (!) 56  Resp: 19 18 18 18   Temp: 97.7 F (36.5 C) 98 F (36.7 C) 97.8 F (36.6 C) 97.8 F (36.6 C)  TempSrc: Oral Oral Oral Oral  SpO2: 93% 100% 96% 98%  Weight:      Height:        LLE, dressing CDI. Fires EHL/TA/GS 2+ DP pulse  Lab Results  Component Value Date   WBC 9.2 09/10/2024   HGB 14.0 09/10/2024   HCT 45.5 09/10/2024   MCV 83.6 09/10/2024   PLT 233 09/10/2024     Assessment/Plan:  1 Day Post-Op status post right TKA pod 1, which some difficulty mobilizing today, plan for additional PT  - Patient to work with PT to optimize mobilization safely - DVT ppx - SCDs, ambulation, Eliquis  - WBAT operative extremity - Pain control - multimodal pain management, ATC acetaminophen  in conjunction with as needed narcotic (oxycodone ), although this should be minimized with other modalities  - Discharge planning pending CM, appreciate coordination    Charles Payne 09/21/2024, 8:42 AM  "

## 2024-09-21 NOTE — TOC Transition Note (Signed)
 Transition of Care [facility]) - Discharge Note   Patient Details  Name: Charles Payne MRN: [id number] Date of Birth: September 10, 1960  [facility] CM/SW Contact:  NORMAN ASPEN, LCSW Phone Number: 09/21/2024, 11:57 AM   Clinical Narrative:     Met with pt who confirms he has needed DME in the home.  HHPT prearranged with [facility].  No further IP CM needs.  Final next level of care: Home w Home Health Services Barriers to Discharge: No Barriers Identified   Patient Goals and CMS Choice Patient states their goals for this hospitalization and ongoing recovery are:: return home          Discharge Placement                       Discharge Plan and Services Additional resources added to the After Visit Summary for                  DME Arranged: N/A DME Agency: NA       HH Arranged: PT HH Agency: [facility]        Social Drivers of Health (SDOH) Interventions SDOH Screenings   Food Insecurity: No Food Insecurity (09/20/2024)  Housing: Low Risk (09/20/2024)  Transportation Needs: No Transportation Needs (09/20/2024)  Utilities: Not At Risk (09/20/2024)  Alcohol  Screen: Low Risk (06/10/2024)  Depression (PHQ2-9): Low Risk (04/09/2024)  Financial Resource Strain: Medium Risk (06/10/2024)  Physical Activity: Insufficiently Active (06/10/2024)  Social Connections: Moderately Integrated (09/20/2024)  Stress: No Stress Concern Present (06/10/2024)  Tobacco Use: Medium Risk (09/20/2024)     Readmission Risk Interventions     No data to display

## 2024-09-21 NOTE — Discharge Summary (Signed)
 "    Patient ID: Charles Payne MRN: [id number] DOB/AGE: [birth year] 64 y.o.  Admit date: 09/20/2024 Discharge date: 09/21/2024  Admission Diagnoses:  Status post total right knee replacement  Discharge Diagnoses:  Principal Problem:   Status post total right knee replacement   Past Medical History:  Diagnosis Date   Anemia    Arthritis    Diabetes (HCC)    GERD (gastroesophageal reflux disease)    Hypertension    Lumbar facet arthropathy 02/11/2020   Migraines    Mixed hyperlipidemia    Obesity (BMI 35.0-39.9 without comorbidity) 04/02/2009   Formatting of this note might be different from the original. Obesity 10/1 IMO update  Formatting of this note might be different from the original. 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 male who was admitted 09/20/2024 with a diagnosis of Status post total right knee replacement and went to the operating room on 09/20/2024 and underwent the above named procedures.    He was given perioperative antibiotics:  Anti-infectives (From admission, onward)    Start     Dose/Rate Route Frequency Ordered Stop   09/21/24 1000  doxycycline  (VIBRA -TABS) tablet 100 mg       Note to Pharmacy: To be taken after surgery     100 mg Oral 2 times daily 09/20/24 1322     09/20/24 1415  ceFAZolin  (ANCEF ) IVPB 2g/100 mL premix        2 g 200 mL/hr over 30 Minutes Intravenous Every 6 hours 09/20/24 1322 09/21/24 0834   09/20/24 0816  vancomycin  (VANCOCIN ) powder  Status:  Discontinued          As needed 09/20/24 0816 09/20/24 0929   09/20/24 0600  ceFAZolin  (ANCEF ) IVPB 2g/100 mL premix        2  g 200 mL/hr over 30 Minutes Intravenous On call to O.R. 09/20/24 0550 09/20/24 0809   09/20/24 0000  doxycycline  (VIBRA -TABS) 100 MG tablet        100 mg Oral 2 times daily 09/20/24 0744       .  He was given sequential compression devices, early ambulation, and appropriate chemoprophylaxis for DVT prophylaxis.  He benefited maximally from the hospital stay and there were no complications.    Recent vital signs:  Vitals:   09/20/24 2355 09/21/24 0609  BP: (!) 143/79 (!) 173/88  Pulse: (!) 55 (!) 56  Resp: 18 18  Temp: 97.8 F (36.6 C) 97.8 F (36.6 C)  SpO2: 96% 98%    Recent laboratory studies:  Lab Results  Component Value Date   HGB 14.0 09/10/2024   HGB 13.7 07/11/2024   HGB 13.9 04/09/2024   Lab Results  Component Value Date   WBC 9.2 09/10/2024   PLT 233 09/10/2024   No results found for: INR Lab Results  Component Value Date   NA 140 09/10/2024   K 4.2 09/10/2024   CL 106 09/10/2024   CO2 21 (L) 09/10/2024   BUN 15 09/10/2024   CREATININE 1.14 09/10/2024   GLUCOSE 238 (H) 09/10/2024  Discharge Medications:   Allergies as of 09/21/2024   No Known Allergies      Medication List     TAKE these medications    apixaban  2.5 MG Tabs tablet Commonly known as: Eliquis  Take one tab po bid x 30 days after surgery to prevent blood clots   aspirin 81 MG chewable tablet Chew 81 mg by mouth in the morning.   Blood Glucose Monitoring Suppl Devi 1 each by Does not apply route in the morning, at noon, and at bedtime. May substitute to any manufacturer covered by patient's insurance.   docusate sodium  100 MG capsule Commonly known as: Colace Take 1 capsule (100 mg total) by mouth daily as needed.   doxycycline  100 MG tablet Commonly known as: VIBRA -TABS Take 1 tablet (100 mg total) by mouth 2 (two) times daily. To be taken after surgery   empagliflozin  10 MG Tabs tablet Commonly known as: JARDIANCE  Take 1 tablet (10 mg total) by mouth daily.    fluocinonide  cream 0.05 % Commonly known as: LIDEX  Apply 1 Application topically 2 (two) times daily. Apply to affected area two times daily.   glipiZIDE  10 MG tablet Commonly known as: GLUCOTROL  Take 1 tablet (10 mg total) by mouth 2 (two) times daily.   lisinopril  5 MG tablet Commonly known as: ZESTRIL  Take 1 tablet (5 mg total) by mouth in the morning.   Lubricant Eye Drops 0.4-0.3 % Soln Generic drug: Polyethyl Glycol-Propyl Glycol Place 1-2 drops into both eyes 3 (three) times daily as needed (dry/irritated eyes).   metFORMIN 1000 MG tablet Commonly known as: GLUCOPHAGE TAKE 1 TABLET BY MOUTH TWICE A DAY WITH MORNING AND EVENING MEALS What changed: See the new instructions.   methocarbamol  750 MG tablet Commonly known as: ROBAXIN  Take 1 tablet (750 mg total) by mouth 3 (three) times daily as needed.   nicotine  polacrilex 2 MG lozenge Commonly known as: COMMIT RX #2 Weeks 7-9: 1 lozenge every 2-4 hours.Max 20 lozenges per day.   nystatin  powder Commonly known as: MYCOSTATIN /NYSTOP  Apply 1 Application topically 3 (three) times daily. Apply to affected area three times daily.   ondansetron  4 MG tablet Commonly known as: ZOFRAN  Take 1 tablet (4 mg total) by mouth every 8 (eight) hours as needed for nausea or vomiting.   ondansetron  4 MG tablet Commonly known as: ZOFRAN  Take 1 tablet (4 mg total) by mouth every 8 (eight) hours as needed for nausea or vomiting.   oxyCODONE -acetaminophen  5-325 MG tablet Commonly known as: Percocet Take 1-2 tablets by mouth every 6 (six) hours as needed. To be taken after surgery   Ozempic  (0.25 or 0.5 MG/DOSE) 2 MG/3ML Sopn Generic drug: Semaglutide (0.25 or 0.5MG /DOS) Inject 0.5 mg into the skin once a week.   propranolol  ER 60 MG 24 hr capsule Commonly known as: Inderal  LA Take 1 capsule (60 mg total) by mouth daily. Replaces 20 mg dosing   rosuvastatin  20 MG tablet Commonly known as: Crestor  Take 1 tablet (20 mg total) by  mouth daily. What changed: when to take this               Durable Medical Equipment  (From admission, onward)           Start     Ordered   09/20/24 1323  DME Walker rolling  Once       Question Answer Comment  Walker: With 5 Inch Wheels   Patient needs a walker to treat with the following condition Status post left  partial knee replacement      09/20/24 1322   09/20/24 1323  DME 3 n 1  Once        09/20/24 1322   09/20/24 1323  DME Bedside commode  Once       Question:  Patient needs a bedside commode to treat with the following condition  Answer:  Status post left partial knee replacement   09/20/24 1322            Diagnostic Studies: DG Knee Right Port Result Date: 09/20/2024 CLINICAL DATA:  Pain, postop. EXAM: PORTABLE RIGHT KNEE - 1-2 VIEW COMPARISON:  None Available. FINDINGS: Right knee arthroplasty in expected alignment. No periprosthetic lucency or fracture. Recent postsurgical change includes air and edema in the soft tissues and joint space. IMPRESSION: Right knee arthroplasty without immediate postoperative complication. Electronically Signed   By: Andrea Gasman M.D.   On: 09/20/2024 16:12    Disposition: Discharge disposition: 01-Home or Self Care          Follow-up Information     Jule Ronal CROME, PA-C Follow up.   Specialty: Orthopedic Surgery Contact information: [address] [place]  [place] [address] [phone]                  Signed: ELSPETH PARKER 09/21/2024, 8:47 AM  "

## 2024-09-23 ENCOUNTER — Encounter (HOSPITAL_COMMUNITY): Payer: Self-pay | Admitting: Orthopaedic Surgery

## 2024-10-03 ENCOUNTER — Encounter: Admitting: Physician Assistant

## 2024-11-26 ENCOUNTER — Ambulatory Visit: Admitting: Physician Assistant

## 2024-12-17 ENCOUNTER — Ambulatory Visit: Payer: Worker's Compensation | Admitting: Plastic Surgery
